# Patient Record
Sex: Male | Born: 1965 | Race: Black or African American | Hispanic: No | Smoking: Never smoker
Health system: Southern US, Community
[De-identification: ages and names within clinical notes are randomized; demographics above are authoritative.]

## PROBLEM LIST (undated history)

## (undated) DIAGNOSIS — E119 Type 2 diabetes mellitus without complications: Secondary | ICD-10-CM

## (undated) DIAGNOSIS — Z8619 Personal history of other infectious and parasitic diseases: Secondary | ICD-10-CM

## (undated) DIAGNOSIS — I10 Essential (primary) hypertension: Secondary | ICD-10-CM

## (undated) HISTORY — DX: Personal history of other infectious and parasitic diseases: Z86.19

## (undated) HISTORY — PX: BACK SURGERY: SHX140

## (undated) HISTORY — PX: SHOULDER ARTHROSCOPY: SHX128

---

## 2002-03-28 DIAGNOSIS — E11311 Type 2 diabetes mellitus with unspecified diabetic retinopathy with macular edema: Secondary | ICD-10-CM | POA: Insufficient documentation

## 2002-03-28 DIAGNOSIS — I10 Essential (primary) hypertension: Secondary | ICD-10-CM | POA: Insufficient documentation

## 2002-08-05 ENCOUNTER — Encounter: Admission: RE | Admit: 2002-08-05 | Discharge: 2002-11-03 | Payer: Self-pay | Admitting: Endocrinology

## 2005-02-11 ENCOUNTER — Ambulatory Visit: Payer: Self-pay | Admitting: Orthopaedic Surgery

## 2005-03-11 ENCOUNTER — Ambulatory Visit: Payer: Self-pay | Admitting: Orthopaedic Surgery

## 2006-09-29 ENCOUNTER — Emergency Department: Payer: Self-pay | Admitting: Emergency Medicine

## 2006-10-04 DIAGNOSIS — S39012A Strain of muscle, fascia and tendon of lower back, initial encounter: Secondary | ICD-10-CM | POA: Insufficient documentation

## 2006-10-12 DIAGNOSIS — E11311 Type 2 diabetes mellitus with unspecified diabetic retinopathy with macular edema: Secondary | ICD-10-CM | POA: Insufficient documentation

## 2007-10-09 DIAGNOSIS — R109 Unspecified abdominal pain: Secondary | ICD-10-CM | POA: Insufficient documentation

## 2007-10-11 ENCOUNTER — Ambulatory Visit: Payer: Self-pay | Admitting: Family Medicine

## 2007-11-08 ENCOUNTER — Ambulatory Visit: Payer: Self-pay | Admitting: Gastroenterology

## 2008-03-14 ENCOUNTER — Ambulatory Visit: Payer: Self-pay | Admitting: Otolaryngology

## 2008-07-14 ENCOUNTER — Ambulatory Visit: Payer: Self-pay | Admitting: Unknown Physician Specialty

## 2008-08-09 ENCOUNTER — Ambulatory Visit: Payer: Self-pay | Admitting: Physician Assistant

## 2008-08-14 ENCOUNTER — Ambulatory Visit: Payer: Self-pay | Admitting: Unknown Physician Specialty

## 2008-08-15 ENCOUNTER — Inpatient Hospital Stay: Payer: Self-pay | Admitting: Unknown Physician Specialty

## 2009-01-26 ENCOUNTER — Ambulatory Visit: Payer: Self-pay | Admitting: Gastroenterology

## 2009-02-16 ENCOUNTER — Ambulatory Visit: Payer: Self-pay | Admitting: Gastroenterology

## 2009-02-16 LAB — HM COLONOSCOPY: HM COLON: NORMAL

## 2009-04-22 DIAGNOSIS — K589 Irritable bowel syndrome without diarrhea: Secondary | ICD-10-CM | POA: Insufficient documentation

## 2009-06-09 DIAGNOSIS — Z8042 Family history of malignant neoplasm of prostate: Secondary | ICD-10-CM | POA: Insufficient documentation

## 2011-02-11 DIAGNOSIS — H3500 Unspecified background retinopathy: Secondary | ICD-10-CM | POA: Insufficient documentation

## 2011-02-24 LAB — PSA: PSA: 0.4

## 2012-09-17 LAB — BASIC METABOLIC PANEL
BUN: 11 mg/dL (ref 4–21)
Creatinine: 1.1 mg/dL (ref <=1.3)
Glucose: 112 mg/dL
Sodium: 143 mmol/L (ref 137–147)

## 2012-09-17 LAB — TSH: TSH: 2.63 u[IU]/mL (ref <=5.90)

## 2012-12-21 DIAGNOSIS — M5416 Radiculopathy, lumbar region: Secondary | ICD-10-CM | POA: Insufficient documentation

## 2012-12-21 DIAGNOSIS — M545 Low back pain, unspecified: Secondary | ICD-10-CM | POA: Insufficient documentation

## 2012-12-21 DIAGNOSIS — G8929 Other chronic pain: Secondary | ICD-10-CM | POA: Insufficient documentation

## 2014-04-07 ENCOUNTER — Encounter (INDEPENDENT_AMBULATORY_CARE_PROVIDER_SITE_OTHER): Payer: 59 | Admitting: Ophthalmology

## 2014-04-07 DIAGNOSIS — E11311 Type 2 diabetes mellitus with unspecified diabetic retinopathy with macular edema: Secondary | ICD-10-CM

## 2014-04-07 DIAGNOSIS — I10 Essential (primary) hypertension: Secondary | ICD-10-CM

## 2014-04-07 DIAGNOSIS — H43813 Vitreous degeneration, bilateral: Secondary | ICD-10-CM

## 2014-04-07 DIAGNOSIS — E11331 Type 2 diabetes mellitus with moderate nonproliferative diabetic retinopathy with macular edema: Secondary | ICD-10-CM

## 2014-04-07 DIAGNOSIS — H2513 Age-related nuclear cataract, bilateral: Secondary | ICD-10-CM

## 2014-04-07 DIAGNOSIS — H35033 Hypertensive retinopathy, bilateral: Secondary | ICD-10-CM

## 2014-04-11 ENCOUNTER — Encounter (INDEPENDENT_AMBULATORY_CARE_PROVIDER_SITE_OTHER): Payer: 59 | Admitting: Ophthalmology

## 2014-04-11 DIAGNOSIS — E11311 Type 2 diabetes mellitus with unspecified diabetic retinopathy with macular edema: Secondary | ICD-10-CM

## 2014-04-11 DIAGNOSIS — E11331 Type 2 diabetes mellitus with moderate nonproliferative diabetic retinopathy with macular edema: Secondary | ICD-10-CM

## 2014-05-09 ENCOUNTER — Encounter (INDEPENDENT_AMBULATORY_CARE_PROVIDER_SITE_OTHER): Payer: 59 | Admitting: Ophthalmology

## 2014-05-09 DIAGNOSIS — E10331 Type 1 diabetes mellitus with moderate nonproliferative diabetic retinopathy with macular edema: Secondary | ICD-10-CM

## 2014-05-09 DIAGNOSIS — E10339 Type 1 diabetes mellitus with moderate nonproliferative diabetic retinopathy without macular edema: Secondary | ICD-10-CM

## 2014-05-09 DIAGNOSIS — E10311 Type 1 diabetes mellitus with unspecified diabetic retinopathy with macular edema: Secondary | ICD-10-CM

## 2014-05-09 DIAGNOSIS — H2513 Age-related nuclear cataract, bilateral: Secondary | ICD-10-CM

## 2014-05-09 DIAGNOSIS — H35033 Hypertensive retinopathy, bilateral: Secondary | ICD-10-CM

## 2014-05-09 DIAGNOSIS — H43813 Vitreous degeneration, bilateral: Secondary | ICD-10-CM

## 2014-05-09 DIAGNOSIS — I10 Essential (primary) hypertension: Secondary | ICD-10-CM

## 2014-06-06 ENCOUNTER — Encounter (INDEPENDENT_AMBULATORY_CARE_PROVIDER_SITE_OTHER): Payer: 59 | Admitting: Ophthalmology

## 2014-06-06 DIAGNOSIS — E10311 Type 1 diabetes mellitus with unspecified diabetic retinopathy with macular edema: Secondary | ICD-10-CM

## 2014-06-06 DIAGNOSIS — E10331 Type 1 diabetes mellitus with moderate nonproliferative diabetic retinopathy with macular edema: Secondary | ICD-10-CM | POA: Diagnosis not present

## 2014-06-06 DIAGNOSIS — H35033 Hypertensive retinopathy, bilateral: Secondary | ICD-10-CM | POA: Diagnosis not present

## 2014-06-06 DIAGNOSIS — H43813 Vitreous degeneration, bilateral: Secondary | ICD-10-CM | POA: Diagnosis not present

## 2014-06-06 DIAGNOSIS — I10 Essential (primary) hypertension: Secondary | ICD-10-CM | POA: Diagnosis not present

## 2014-06-06 DIAGNOSIS — E10329 Type 1 diabetes mellitus with mild nonproliferative diabetic retinopathy without macular edema: Secondary | ICD-10-CM | POA: Diagnosis not present

## 2014-06-27 HISTORY — PX: CATARACT EXTRACTION: SUR2

## 2014-07-11 ENCOUNTER — Encounter (INDEPENDENT_AMBULATORY_CARE_PROVIDER_SITE_OTHER): Payer: 59 | Admitting: Ophthalmology

## 2014-07-11 DIAGNOSIS — H35033 Hypertensive retinopathy, bilateral: Secondary | ICD-10-CM | POA: Diagnosis not present

## 2014-07-11 DIAGNOSIS — I10 Essential (primary) hypertension: Secondary | ICD-10-CM | POA: Diagnosis not present

## 2014-07-11 DIAGNOSIS — E10311 Type 1 diabetes mellitus with unspecified diabetic retinopathy with macular edema: Secondary | ICD-10-CM | POA: Diagnosis not present

## 2014-07-11 DIAGNOSIS — H43813 Vitreous degeneration, bilateral: Secondary | ICD-10-CM

## 2014-07-11 DIAGNOSIS — E10329 Type 1 diabetes mellitus with mild nonproliferative diabetic retinopathy without macular edema: Secondary | ICD-10-CM

## 2014-07-11 DIAGNOSIS — E10331 Type 1 diabetes mellitus with moderate nonproliferative diabetic retinopathy with macular edema: Secondary | ICD-10-CM | POA: Diagnosis not present

## 2014-07-11 DIAGNOSIS — H2513 Age-related nuclear cataract, bilateral: Secondary | ICD-10-CM

## 2014-07-25 LAB — HM DIABETES EYE EXAM

## 2014-08-08 ENCOUNTER — Ambulatory Visit
Admission: RE | Admit: 2014-08-08 | Discharge: 2014-08-08 | Disposition: A | Discharge status: Home / Self Care | Payer: 59 | Source: Ambulatory Visit | Attending: Family Medicine | Admitting: Family Medicine

## 2014-08-08 ENCOUNTER — Other Ambulatory Visit: Payer: Self-pay | Admitting: Family Medicine

## 2014-08-08 DIAGNOSIS — M542 Cervicalgia: Secondary | ICD-10-CM

## 2014-08-21 ENCOUNTER — Encounter (INDEPENDENT_AMBULATORY_CARE_PROVIDER_SITE_OTHER): Payer: 59 | Admitting: Ophthalmology

## 2014-08-21 DIAGNOSIS — E10311 Type 1 diabetes mellitus with unspecified diabetic retinopathy with macular edema: Secondary | ICD-10-CM | POA: Diagnosis not present

## 2014-08-21 DIAGNOSIS — I10 Essential (primary) hypertension: Secondary | ICD-10-CM | POA: Diagnosis not present

## 2014-08-21 DIAGNOSIS — E10331 Type 1 diabetes mellitus with moderate nonproliferative diabetic retinopathy with macular edema: Secondary | ICD-10-CM | POA: Diagnosis not present

## 2014-08-21 DIAGNOSIS — H35033 Hypertensive retinopathy, bilateral: Secondary | ICD-10-CM | POA: Diagnosis not present

## 2014-08-21 DIAGNOSIS — H43813 Vitreous degeneration, bilateral: Secondary | ICD-10-CM | POA: Diagnosis not present

## 2014-08-21 DIAGNOSIS — E10329 Type 1 diabetes mellitus with mild nonproliferative diabetic retinopathy without macular edema: Secondary | ICD-10-CM

## 2014-09-25 ENCOUNTER — Encounter (INDEPENDENT_AMBULATORY_CARE_PROVIDER_SITE_OTHER): Payer: 59 | Admitting: Ophthalmology

## 2014-09-25 DIAGNOSIS — E10331 Type 1 diabetes mellitus with moderate nonproliferative diabetic retinopathy with macular edema: Secondary | ICD-10-CM

## 2014-09-25 DIAGNOSIS — E10329 Type 1 diabetes mellitus with mild nonproliferative diabetic retinopathy without macular edema: Secondary | ICD-10-CM

## 2014-09-25 DIAGNOSIS — H35033 Hypertensive retinopathy, bilateral: Secondary | ICD-10-CM

## 2014-09-25 DIAGNOSIS — H2512 Age-related nuclear cataract, left eye: Secondary | ICD-10-CM

## 2014-09-25 DIAGNOSIS — H43813 Vitreous degeneration, bilateral: Secondary | ICD-10-CM | POA: Diagnosis not present

## 2014-09-25 DIAGNOSIS — E11311 Type 2 diabetes mellitus with unspecified diabetic retinopathy with macular edema: Secondary | ICD-10-CM | POA: Diagnosis not present

## 2014-09-25 DIAGNOSIS — I10 Essential (primary) hypertension: Secondary | ICD-10-CM | POA: Diagnosis not present

## 2014-11-10 HISTORY — PX: CERVICAL FUSION: SHX112

## 2014-11-15 ENCOUNTER — Emergency Department
Admission: EM | Admit: 2014-11-15 | Discharge: 2014-11-15 | Disposition: A | Discharge status: Other Acute Inpatient Hospital | Payer: 59 | Attending: Emergency Medicine | Admitting: Emergency Medicine

## 2014-11-15 ENCOUNTER — Emergency Department: Payer: 59

## 2014-11-15 DIAGNOSIS — L7632 Postprocedural hematoma of skin and subcutaneous tissue following other procedure: Secondary | ICD-10-CM

## 2014-11-15 DIAGNOSIS — Y838 Other surgical procedures as the cause of abnormal reaction of the patient, or of later complication, without mention of misadventure at the time of the procedure: Secondary | ICD-10-CM | POA: Insufficient documentation

## 2014-11-15 DIAGNOSIS — E119 Type 2 diabetes mellitus without complications: Secondary | ICD-10-CM | POA: Insufficient documentation

## 2014-11-15 DIAGNOSIS — L7622 Postprocedural hemorrhage and hematoma of skin and subcutaneous tissue following other procedure: Secondary | ICD-10-CM | POA: Insufficient documentation

## 2014-11-15 DIAGNOSIS — S1093XA Contusion of unspecified part of neck, initial encounter: Secondary | ICD-10-CM

## 2014-11-15 HISTORY — DX: Type 2 diabetes mellitus without complications: E11.9

## 2014-11-15 HISTORY — DX: Essential (primary) hypertension: I10

## 2014-11-15 LAB — COMPREHENSIVE METABOLIC PANEL
ALBUMIN: 4.3 g/dL (ref 3.5–5.0)
ALK PHOS: 38 U/L (ref 38–126)
ALT: 23 U/L (ref 17–63)
AST: 24 U/L (ref 15–41)
Anion gap: 8 (ref 5–15)
BILIRUBIN TOTAL: 1.5 mg/dL — AB (ref 0.3–1.2)
BUN: 13 mg/dL (ref 6–20)
CALCIUM: 9.6 mg/dL (ref 8.9–10.3)
CO2: 28 mmol/L (ref 22–32)
Chloride: 102 mmol/L (ref 101–111)
Creatinine, Ser: 1.06 mg/dL (ref 0.61–1.24)
GFR calc Af Amer: >60 mL/min (ref >60)
Glucose, Bld: 74 mg/dL (ref 65–99)
Potassium: 4 mmol/L (ref 3.5–5.1)
Sodium: 138 mmol/L (ref 135–145)
TOTAL PROTEIN: 7.9 g/dL (ref 6.5–8.1)

## 2014-11-15 LAB — CBC
HEMATOCRIT: 37.9 % — AB (ref 40.0–52.0)
HEMOGLOBIN: 12.5 g/dL — AB (ref 13.0–18.0)
MCH: 27.7 pg (ref 26.0–34.0)
MCHC: 33 g/dL (ref 32.0–36.0)
MCV: 84.1 fL (ref 80.0–100.0)
Platelets: 276 10*3/uL (ref 150–440)
RBC: 4.51 MIL/uL (ref 4.40–5.90)
RDW: 14.2 % (ref 11.5–14.5)
WBC: 6.9 10*3/uL (ref 3.8–10.6)

## 2014-11-15 LAB — PROTIME-INR
INR: 1.08
PROTHROMBIN TIME: 14.2 s (ref 11.4–15.0)

## 2014-11-15 MED ORDER — PROMETHAZINE HCL 25 MG/ML IJ SOLN
25.0000 mg | Freq: Once | INTRAMUSCULAR | Status: AC
  Administered 2014-11-15: 25 mg via INTRAVENOUS
  Filled 2014-11-15: qty 1

## 2014-11-15 MED ORDER — SODIUM CHLORIDE 0.9 % IV BOLUS (SEPSIS)
1000.0000 mL | Freq: Once | INTRAVENOUS | Status: AC
  Administered 2014-11-15: 1000 mL via INTRAVENOUS

## 2014-11-15 MED ORDER — DEXAMETHASONE SODIUM PHOSPHATE 10 MG/ML IJ SOLN
10.0000 mg | Freq: Once | INTRAMUSCULAR | Status: AC
  Administered 2014-11-15: 10 mg via INTRAVENOUS
  Filled 2014-11-15: qty 1

## 2014-11-15 MED ORDER — SODIUM CHLORIDE 0.9 % IV SOLN
3.0000 g | INTRAVENOUS | Status: AC
  Administered 2014-11-15: 3 g via INTRAVENOUS
  Filled 2014-11-15: qty 3

## 2014-11-15 MED ORDER — MORPHINE SULFATE (PF) 4 MG/ML IV SOLN
4.0000 mg | Freq: Once | INTRAVENOUS | Status: AC
  Administered 2014-11-15: 4 mg via INTRAVENOUS
  Filled 2014-11-15: qty 1

## 2014-11-15 MED ORDER — IOHEXOL 300 MG/ML  SOLN
75.0000 mL | Freq: Once | INTRAMUSCULAR | Status: AC | PRN
  Administered 2014-11-15: 75 mL via INTRAVENOUS

## 2014-11-15 NOTE — ED Notes (Signed)
MD notified of pt's c/o worsening gurgling and difficulty breathing. Pt is maintaining airway, O2 sats WNL.

## 2014-11-15 NOTE — ED Provider Notes (Signed)
-----------------------------------------   3:30 PM on 11/15/2014 -----------------------------------------   Blood pressure 140/84, pulse 88, temperature 98.4 F (36.9 C), temperature source Oral, resp. rate 18, height  (1.93 m), weight 290 lb (131.543 kg), SpO2 97 %.  Assuming care from Dr. Lenard Lance.  In short, Jimmy Mills is a 49 y.o. male with a chief complaint of Post-op Problem .  Refer to the original H&P for additional details.  The current plan of care is to follow-up the CT scan of the patient's neck and reassess.  ----------------------------------------- 5:05 PM on 11/15/2014 -----------------------------------------  I followed up the CT scan results and it is very concerning for an expanding hematoma but more worrisome for a postoperative infection in spite of the fact that he is afebrile and has no leukocytosis.  There is gas present in the wound and soft tissue swelling described as "severe" by the radiologist.  I ordered Decadron 10 mg IV to help with swelling and Unasyn 3 g IV as empiric antibiotics treatment.  I then called our on-call otolaryngologist, Dr. Jenne Campus.  I discussed the case with him by phone and he said that he would come to the emergency department for evaluation of her wanted, but he strongly encouraged me to get in touch with the patient's surgeon and get the patient transferred to the appropriate facility as soon as possible.  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  I updated the patient and got the name of the surgeon, Dr. Iline Oven, who is associated with [facility].  We are trying to track down the on-call surgeon for his group at this time.  ----------------------------------------- 5:16 PM on 11/15/2014 -----------------------------------------  Just spoke by phone with the on-call surgeon, Dr. Lynnae Prude,  for [facility].  I discussed  the case with him.  He is going to talk with the spine surgeons in his group and call me back.  ----------------------------------------- 5:54 PM on 11/15/2014 -----------------------------------------  Spoke again with Dr. Winfred Leeds who spoke with the spine surgeon, Dr. Waymon Budge.  Dr. Waymon Budge reportedly feels it is unlikely to be an infection but is willing to take the patient as a transfer to [facility].  At Dr. Winfred Leeds request, I spoke directly to the charge nurse, Margretta Sidle [phone]), and gave report.  I also stressed, both to Dr. Winfred Leeds and to [facility], that the patient is finding it increasingly difficult to swallow, though he is non-toxic appearing and is still easily protecting his airway.  I strongly suggested to [facility] that they make sure Dr. Waymon Budge is aware of the patient's arrival so that he can come evaluate the patient in person.  I updated the patient and reassessed him.  Again, he is protecting his airway and is handling his secretions, and I believe he is stable for urgent transport to [facility] - at this time the benefits outweigh the risks, and the risk of intubating this patient while he is stable are too great given that he is not in immediate danger.     Loleta Rose, MD 11/15/14 1800

## 2014-11-15 NOTE — ED Notes (Signed)
Pt returned from CT scan.

## 2014-11-15 NOTE — ED Provider Notes (Signed)
[facility] Emergency Department Provider Note  Time seen: 1:51 PM  I have reviewed the triage vital signs and the nursing notes.   HISTORY  Chief Complaint Post-op Problem    HPI Jimmy Mills is a 49 y.o. male with a past medical history of diabetes, recent cervical spinal fusion 1 week ago presents the emergency department with continued bleeding from his surgical wound, along with swelling and trouble swallowing. According to the patient he has had mild bleeding from the neck wound since they pulled the drain the day after surgery. Says the bleeding has continued and it soaks through the dressing approximately every 2 days. What he is more concerned about is he feels that the area has been swelling, and he now feels like it is more difficult for him to swallow or take a deep breath. Patient states pain to the area, but no acute increase in pain since his surgery. If anything the pain has decreased per patient. Denies any weakness or numbness of any arm or leg. Denies any fevers. Describes his neck pain is moderate currently but largely unchanged since surgery.    Past Medical History  Diagnosis Date  . Diabetes mellitus without complication     There are no active problems to display for this patient.   Past Surgical History  Procedure Laterality Date  . Cervical fusion  11/10/2014  . Back surgery    . Shoulder arthroscopy Right     No current outpatient prescriptions on file.  Allergies Review of patient's allergies indicates no known allergies.  No family history on file.  Social History Social History  Substance Use Topics  . Smoking status: Never Smoker   . Smokeless tobacco: Never Used  . Alcohol Use: No    Review of Systems Constitutional: Negative for fever. Cardiovascular: Negative for chest pain. Respiratory: Negative for shortness of breath. Gastrointestinal: Negative for abdominal pain Neurological: Negative for headaches,  focal weakness or numbness. 10-point ROS otherwise negative.  ____________________________________________   PHYSICAL EXAM:  VITAL SIGNS: ED Triage Vitals  Enc Vitals Group     BP 11/15/14 1308 140/84 mmHg     Pulse Rate 11/15/14 1308 88     Resp 11/15/14 1308 18     Temp 11/15/14 1308 98.4 F (36.9 C)     Temp Source 11/15/14 1308 Oral     SpO2 11/15/14 1308 99 %     Weight 11/15/14 1308 290 lb (131.543 kg)     Height 11/15/14 1308  (1.93 m)     Head Cir --      Peak Flow --      Pain Score 11/15/14 1309 6     Pain Loc --      Pain Edu? --      Excl. in GC? --     Constitutional: Alert and oriented. Well appearing and in no distress. Eyes: Normal exam ENT   Head: Normocephalic and atraumatic.   Mouth/Throat: Mucous membranes are moist. No oral/pharyngeal swelling noted. Soft c-collar in place. Patient has a surgical incision approximately 4 cm in length of the left anterior neck. Small area approximately 0.5 cm is slightly open, with the patient states he had a drain placed. Mild oozing of blood from this area, otherwise appears well. Mi mild swelling consistent with mild hematoma present. Cardiovascular: Normal rate, regular rhythm. No murmur Respiratory: Normal respiratory effort without tachypnea nor retractions. Breath sounds are clear and equal bilaterally. No wheezes/rales/rhonchi. Gastrointestinal: Soft and nontender. No  distention.   Musculoskeletal: Nontender with normal range of motion in all extremities.  Neurologic:  Normal speech and language. No gross focal neurologic deficits Skin:  Skin is warm, dry and intact.  Psychiatric: Mood and affect are normal. Speech and behavior are normal.   ____________________________________________   INITIAL IMPRESSION / ASSESSMENT AND PLAN / ED COURSE  Pertinent labs & imaging results that were available during my care of the patient were reviewed by me and considered in my medical decision making (see chart  for details).  Patient overall appears quite well. He does have minimal oozing of blood from his surgical incision, as well as mild swelling consistent with a small hematoma underlying the skin. Do not see any external signs of airway compromise. No stridor on exam. We will check labs, and proceed with a CT neck with contrast to help evaluate the vasculature, rule out enlarging hematoma, or mass effect on the airway.   Labs within normal limits. CT pending patient care signed out to Dr. York Cerise.  ____________________________________________   FINAL CLINICAL IMPRESSION(S) / ED DIAGNOSES  Postsurgical bleeding Postsurgical swelling   Minna Antis, MD 11/15/14 [phone]

## 2014-11-15 NOTE — ED Notes (Signed)
Pt states he had cervical fusion surgery on Monday at [facility] in [place]..states he has had continuous oozing of blood from site with swelling today having difficulty breathing and swallowing..pt is alert on arrival..

## 2014-12-30 ENCOUNTER — Encounter: Payer: 59 | Admitting: Family Medicine

## 2014-12-30 DIAGNOSIS — E7800 Pure hypercholesterolemia, unspecified: Secondary | ICD-10-CM | POA: Insufficient documentation

## 2014-12-30 DIAGNOSIS — M65959 Unspecified synovitis and tenosynovitis, unspecified thigh: Secondary | ICD-10-CM | POA: Insufficient documentation

## 2014-12-30 DIAGNOSIS — M542 Cervicalgia: Secondary | ICD-10-CM | POA: Insufficient documentation

## 2014-12-30 DIAGNOSIS — M659 Synovitis and tenosynovitis, unspecified: Secondary | ICD-10-CM | POA: Insufficient documentation

## 2014-12-30 DIAGNOSIS — K5900 Constipation, unspecified: Secondary | ICD-10-CM | POA: Insufficient documentation

## 2014-12-30 DIAGNOSIS — Z8619 Personal history of other infectious and parasitic diseases: Secondary | ICD-10-CM | POA: Insufficient documentation

## 2014-12-30 DIAGNOSIS — E669 Obesity, unspecified: Secondary | ICD-10-CM | POA: Insufficient documentation

## 2014-12-30 DIAGNOSIS — L309 Dermatitis, unspecified: Secondary | ICD-10-CM | POA: Insufficient documentation

## 2014-12-30 DIAGNOSIS — K5909 Other constipation: Secondary | ICD-10-CM | POA: Insufficient documentation

## 2014-12-30 HISTORY — DX: Personal history of other infectious and parasitic diseases: Z86.19

## 2015-01-01 ENCOUNTER — Encounter: Payer: Self-pay | Admitting: Family Medicine

## 2015-01-01 ENCOUNTER — Ambulatory Visit (INDEPENDENT_AMBULATORY_CARE_PROVIDER_SITE_OTHER): Payer: 59 | Admitting: Family Medicine

## 2015-01-01 ENCOUNTER — Encounter: Payer: 59 | Admitting: Family Medicine

## 2015-01-01 ENCOUNTER — Ambulatory Visit
Admission: RE | Admit: 2015-01-01 | Discharge: 2015-01-01 | Disposition: A | Discharge status: Home / Self Care | Payer: 59 | Source: Ambulatory Visit | Attending: Family Medicine | Admitting: Family Medicine

## 2015-01-01 ENCOUNTER — Telehealth: Payer: Self-pay | Admitting: *Deleted

## 2015-01-01 VITALS — BP 104/64 | HR 83 | Temp 98.8°F | Resp 16 | Ht 76.0 in | Wt 299.0 lb

## 2015-01-01 DIAGNOSIS — R600 Localized edema: Secondary | ICD-10-CM | POA: Diagnosis present

## 2015-01-01 DIAGNOSIS — E669 Obesity, unspecified: Secondary | ICD-10-CM

## 2015-01-01 MED ORDER — CEPHALEXIN 500 MG PO CAPS: 500.0000 mg | ORAL_CAPSULE | Freq: Four times a day (QID) | ORAL | Status: AC

## 2015-01-01 NOTE — Telephone Encounter (Signed)
Patient notified of results. Patient expressed understanding.  

## 2015-01-01 NOTE — Telephone Encounter (Signed)
Please advise there is no sign of a blood clot in his leg. i think he has a mild infection and have sent rx for cephalexin to [facility] pharmacy. He needs to keep leg elevated when not ambulating. Call if not much better over the weekend.

## 2015-01-01 NOTE — Progress Notes (Signed)
       Patient: Jimmy Mills Male    DOB: August 26, 1965   49 y.o.   MRN: [id number] Visit Date: 01/01/2015  Today's Provider: Mila Merry, MD   Chief Complaint  Patient presents with  . Foot Swelling  . Joint Swelling   Subjective:    HPI One week swelling in right foot and ankles. Feet a little sore. Has had no recent injuries. No redness or sores in feet. No dyspnea. No chest pains. No recent medication changes. No OTC medications.  Had neck surgery in August and has had to limit his activity. He also recently had trip by air to [place].    Obesity: Has put on weight since his neck surgery in August. His BMI is over the goal set by his [employer] and he needs alternative goal. He usually walks 3-4 miles a day. He is noted to have gained 9 pounds since August, but is well below his weight last year of 306 pounds one year ago.     Wt Readings from Last 3 Encounters:  01/01/15 299 lb (135.626 kg)  11/15/14 290 lb (131.543 kg)       Allergies  Allergen Reactions  . Doxycycline Rash   Previous Medications   AMLODIPINE (NORVASC) 5 MG TABLET    Take 5 mg by mouth daily.   DICYCLOMINE (BENTYL) 20 MG TABLET       INSULIN LISPRO PROTAMINE-LISPRO (HUMALOG 75/25 MIX) (75-25) 100 UNIT/ML SUSP INJECTION    Inject 25 Units into the skin 2 (two) times daily with a meal.   LOSARTAN (COZAAR) 100 MG TABLET    Take 100 mg by mouth daily.   ONE TOUCH ULTRA TEST TEST STRIP       ROSUVASTATIN (CRESTOR) 5 MG TABLET    Take 10 mg by mouth 3 (three) times a week.   SITAGLIPTIN-METFORMIN (JANUMET) 50-1000 MG PER TABLET    Take 1 tablet by mouth 2 (two) times daily with a meal.    Review of Systems  Cardiovascular: Negative for chest pain and palpitations.  Musculoskeletal: Positive for joint swelling.       Right foot and ankle swollen x4-5 days. Left foot and ankle also swollen  Neurological: Negative for dizziness and light-headedness.    Social History  Substance Use  Topics  . Smoking status: Never Smoker   . Smokeless tobacco: Never Used  . Alcohol Use: No   Objective:   BP 104/64 mmHg  Pulse 83  Temp(Src) 98.8 F (37.1 C) (Oral)  Resp 16  Ht  (1.93 m)  Wt 299 lb (135.626 kg)  BMI 36.41 kg/m2  SpO2 97%  Physical Exam  General: WD/WN overweight in NAD Lungs CTA, normal work of breathing CV RRR without murmurs Ext: Diffuse faint erythema of right foot, ankle and leg to mid calf. 1+ edema. + calf tenderness. Negative Homan's sign.     Assessment & Plan:     1. Edema of right lower extremity No precipitating event. Consider presence of erythema and calf tenderness we need to rule out DVT as he has had recent air travle and is not on aspirin therapy.  - US Venous Img Lower Unilateral Right; Future Consider abx if ultrasound is negative.   2. Obesity Completed forms for health insurance with alternative BMI goal of 35 by losing 1-2 pounds per month until weight is <190 pounds.        Mila Merry, MD  [facility]

## 2015-01-01 NOTE — Telephone Encounter (Signed)
[facility] contacted office with a call-report for the patient, US venous imaging of lower bilateral right was negative.

## 2015-01-04 NOTE — Progress Notes (Signed)
This encounter was created in error - please disregard.

## 2015-05-27 ENCOUNTER — Encounter: Payer: Self-pay | Admitting: Family Medicine

## 2015-05-27 ENCOUNTER — Ambulatory Visit (INDEPENDENT_AMBULATORY_CARE_PROVIDER_SITE_OTHER): Payer: 59 | Admitting: Family Medicine

## 2015-05-27 VITALS — BP 102/72 | HR 72 | Temp 98.2°F | Resp 16 | Ht 76.0 in | Wt 300.0 lb

## 2015-05-27 DIAGNOSIS — J069 Acute upper respiratory infection, unspecified: Secondary | ICD-10-CM

## 2015-05-27 DIAGNOSIS — R52 Pain, unspecified: Secondary | ICD-10-CM | POA: Diagnosis not present

## 2015-05-27 LAB — POCT INFLUENZA A/B
INFLUENZA A, POC: NEGATIVE
INFLUENZA B, POC: NEGATIVE

## 2015-05-27 MED ORDER — AZITHROMYCIN 250 MG PO TABS: ORAL_TABLET | ORAL | Status: AC

## 2015-05-27 NOTE — Progress Notes (Signed)
Patient: Jimmy Mills Male    DOB: [birth year]   50 y.o.   MRN: [id number] Visit Date: 05/27/2015  Today's Provider: Mila Merry, MD   Chief Complaint  Patient presents with  . Fever   Subjective:    Fever  This is a new problem. The current episode started in the past 7 days (05/21/2015). The problem occurs intermittently. The problem has been unchanged. His temperature was unmeasured prior to arrival. Associated symptoms include congestion, coughing, ear pain, headaches and muscle aches. Pertinent negatives include no abdominal pain, chest pain, diarrhea, nausea, rash, sleepiness, sore throat, urinary pain, vomiting or wheezing. Treatments tried: mucinex  The treatment provided mild relief.   Chills and sweats started 05/21/2015. Body aches, congestion, cough.    Allergies  Allergen Reactions  . Doxycycline Rash   Previous Medications   AMLODIPINE (NORVASC) 5 MG TABLET    Take 5 mg by mouth daily.   DICYCLOMINE (BENTYL) 20 MG TABLET       INSULIN LISPRO PROTAMINE-LISPRO (HUMALOG 75/25 MIX) (75-25) 100 UNIT/ML SUSP INJECTION    Inject 25 Units into the skin 2 (two) times daily with a meal.   LOSARTAN (COZAAR) 100 MG TABLET    Take 100 mg by mouth daily.   ONE TOUCH ULTRA TEST TEST STRIP       ROSUVASTATIN (CRESTOR) 5 MG TABLET    Take 10 mg by mouth 3 (three) times a week.   SITAGLIPTIN-METFORMIN (JANUMET) 50-1000 MG PER TABLET    Take 1 tablet by mouth daily.     Review of Systems  Constitutional: Positive for fever.  HENT: Positive for congestion, ear discharge and ear pain. Negative for sore throat.        Left ear  Respiratory: Positive for cough. Negative for wheezing.   Cardiovascular: Negative for chest pain.  Gastrointestinal: Negative for nausea, vomiting, abdominal pain and diarrhea.  Genitourinary: Negative for dysuria.  Skin: Negative for rash.  Neurological: Positive for headaches.    Social History  Substance Use Topics  . Smoking status: Never  Smoker   . Smokeless tobacco: Never Used  . Alcohol Use: No   Objective:   BP 102/72 mmHg  Pulse 72  Temp(Src) 98.2 F (36.8 C) (Oral)  Resp 16  Ht  (1.93 m)  Wt 300 lb (136.079 kg)  BMI 36.53 kg/m2  SpO2 98%  Physical Exam  General Appearance:    Alert, cooperative, no distress  HENT:   bilateral TM normal without fluid or infection, neck without nodes, sinuses tender and nasal mucosa congested  Eyes:    PERRL, conjunctiva/corneas clear, EOM's intact       Lungs:     Occasional expiratory wheeze, no rales, no rhonchi respirations unlabored  Heart:    Regular rate and rhythm  Neurologic:   Awake, alert, oriented x 3. No apparent focal neurological           defect.         Results for orders placed or performed in visit on 05/27/15  POCT Influenza A/B  Result Value Ref Range   Influenza A, POC Negative Negative   Influenza B, POC Negative Negative       Assessment & Plan:      1. Body aches  - POCT Influenza A/B  2. Upper respiratory infection Worsening after nearly a week. Start abx.  - azithromycin (ZITHROMAX) 250 MG tablet; 2 by mouth today, then 1 daily for 4 days  Dispense:  6 tablet; Refill: 0  Call if symptoms change or if not rapidly improving.          Mila Merry, MD  [facility] +

## 2015-05-27 NOTE — Patient Instructions (Signed)
Upper Respiratory Infection, Adult Most upper respiratory infections (URIs) are a viral infection of the air passages leading to the lungs. A URI affects the nose, throat, and upper air passages. The most common type of URI is nasopharyngitis and is typically referred to as "the common cold." URIs run their course and usually go away on their own. Most of the time, a URI does not require medical attention, but sometimes a bacterial infection in the upper airways can follow a viral infection. This is called a secondary infection. Sinus and middle ear infections are common types of secondary upper respiratory infections. Bacterial pneumonia can also complicate a URI. A URI can worsen asthma and chronic obstructive pulmonary disease (COPD). Sometimes, these complications can require emergency medical care and may be life threatening.  CAUSES Almost all URIs are caused by viruses. A virus is a type of germ and can spread from one person to another.  RISKS FACTORS You may be at risk for a URI if:   You smoke.   You have chronic heart or lung disease.  You have a weakened defense (immune) system.   You are very young or very old.   You have nasal allergies or asthma.  You work in crowded or poorly ventilated areas.  You work in health care facilities or schools. SIGNS AND SYMPTOMS  Symptoms typically develop 2-3 days after you come in contact with a cold virus. Most viral URIs last 7-10 days. However, viral URIs from the influenza virus (flu virus) can last 14-18 days and are typically more severe. Symptoms may include:   Runny or stuffy (congested) nose.   Sneezing.   Cough.   Sore throat.   Headache.   Fatigue.   Fever.   Loss of appetite.   Pain in your forehead, behind your eyes, and over your cheekbones (sinus pain).  Muscle aches.  DIAGNOSIS  Your health care provider may diagnose a URI by:  Physical exam.  Tests to check that your symptoms are not due to  another condition such as:  Strep throat.  Sinusitis.  Pneumonia.  Asthma. TREATMENT  A URI goes away on its own with time. It cannot be cured with medicines, but medicines may be prescribed or recommended to relieve symptoms. Medicines may help:  Reduce your fever.  Reduce your cough.  Relieve nasal congestion. HOME CARE INSTRUCTIONS   Take medicines only as directed by your health care provider.   Gargle warm saltwater or take cough drops to comfort your throat as directed by your health care provider.  Use a warm mist humidifier or inhale steam from a shower to increase air moisture. This may make it easier to breathe.  Drink enough fluid to keep your urine clear or pale yellow.   Eat soups and other clear broths and maintain good nutrition.   Rest as needed.   Return to work when your temperature has returned to normal or as your health care provider advises. You may need to stay home longer to avoid infecting others. You can also use a face mask and careful hand washing to prevent spread of the virus.  Increase the usage of your inhaler if you have asthma.   Do not use any tobacco products, including cigarettes, chewing tobacco, or electronic cigarettes. If you need help quitting, ask your health care provider. PREVENTION  The best way to protect yourself from getting a cold is to practice good hygiene.   Avoid oral or hand contact with people with cold   symptoms.   Wash your hands often if contact occurs.  There is no clear evidence that vitamin C, vitamin E, echinacea, or exercise reduces the chance of developing a cold. However, it is always recommended to get plenty of rest, exercise, and practice good nutrition.  SEEK MEDICAL CARE IF:   You are getting worse rather than better.   Your symptoms are not controlled by medicine.   You have chills.  You have worsening shortness of breath.  You have brown or red mucus.  You have yellow or brown nasal  discharge.  You have pain in your face, especially when you bend forward.  You have a fever.  You have swollen neck glands.  You have pain while swallowing.  You have white areas in the back of your throat. SEEK IMMEDIATE MEDICAL CARE IF:   You have severe or persistent:  Headache.  Ear pain.  Sinus pain.  Chest pain.  You have chronic lung disease and any of the following:  Wheezing.  Prolonged cough.  Coughing up blood.  A change in your usual mucus.  You have a stiff neck.  You have changes in your:  Vision.  Hearing.  Thinking.  Mood. MAKE SURE YOU:   Understand these instructions.  Will watch your condition.  Will get help right away if you are not doing well or get worse.   This information is not intended to replace advice given to you by your health care provider. Make sure you discuss any questions you have with your health care provider.   Document Released: 09/07/2000 Document Revised: 07/29/2014 Document Reviewed: 06/19/2013 Elsevier Interactive Patient Education 2016 [employer] Inc[employer]  

## 2015-09-15 ENCOUNTER — Encounter: Payer: Self-pay | Admitting: Family Medicine

## 2015-09-15 ENCOUNTER — Ambulatory Visit (INDEPENDENT_AMBULATORY_CARE_PROVIDER_SITE_OTHER): Payer: 59 | Admitting: Family Medicine

## 2015-09-15 VITALS — BP 112/76 | HR 84 | Temp 98.4°F | Resp 16 | Wt 307.0 lb

## 2015-09-15 DIAGNOSIS — L200 Besnier's prurigo: Secondary | ICD-10-CM | POA: Diagnosis not present

## 2015-09-15 DIAGNOSIS — L239 Allergic contact dermatitis, unspecified cause: Secondary | ICD-10-CM

## 2015-09-15 NOTE — Progress Notes (Signed)
       Patient: Jimmy FinchKenneth Mihelich Male    DOB: 04/21/1965   50 y.o.   MRN: [id number] Visit Date: 09/15/2015  Today's Provider: Mila Merryonald Fisher, MD   Chief Complaint  Patient presents with  . Pruritis   Subjective:    HPI Itchiness: Patient comes in today complaining of itchiness on his arms and legs x 4 days. He believes he may be having an allergic reaction to shell fish (crab) he 4 nights ago. He states shortly after eating the crab, he became itchy and developed small bumps on his arms and legs. Patient has tried taking Benadryl which helps quite a bit. l. Patient denies any shortness of breath or dyspnea.     Allergies  Allergen Reactions  . Doxycycline Rash   Current Meds  Medication Sig  . amLODipine (NORVASC) 5 MG tablet Take 5 mg by mouth daily.  . insulin lispro protamine-lispro (HUMALOG 75/25 MIX) (75-25) 100 UNIT/ML SUSP injection Inject 25 Units into the skin 2 (two) times daily with a meal.  . losartan (COZAAR) 100 MG tablet Take 100 mg by mouth daily.  . ONE TOUCH ULTRA TEST test strip   . rosuvastatin (CRESTOR) 5 MG tablet Take 10 mg by mouth 3 (three) times a week.  . sitaGLIPtin-metformin (JANUMET) 50-1000 MG per tablet Take 1 tablet by mouth daily.   . [DISCONTINUED] dicyclomine (BENTYL) 20 MG tablet     Review of Systems  Constitutional: Negative for fever, chills and appetite change.  Respiratory: Negative for chest tightness, shortness of breath and wheezing.   Cardiovascular: Negative for chest pain and palpitations.  Gastrointestinal: Negative for nausea, vomiting and abdominal pain.  Skin: Positive for rash.       Itchy skin    Social History  Substance Use Topics  . Smoking status: Never Smoker   . Smokeless tobacco: Never Used  . Alcohol Use: No   Objective:   BP 112/76 mmHg  Pulse 84  Temp(Src) 98.4 F (36.9 C) (Oral)  Resp 16  Wt 307 lb (139.254 kg)  SpO2 97%  Physical Exam  General appearance: alert, well developed, well nourished,  cooperative and in no distress Head: Normocephalic, without obvious abnormality, atraumatic Respiratory: Respirations even and unlabored, normal respiratory rate Extremities: No gross deformities Skin: Few scattered patches of flesh colored tiny papular lesion on forearms and lower legs.      Assessment & Plan:     1. Allergic dermatitis Slowly improving, expect complete resolution in 3-4 days. Can take OTC cetirizine or fexofenadine.      The entirety of the information documented in the History of Present Illness, Review of Systems and Physical Exam were personally obtained by me. Portions of this information were initially documented by Awilda Billoshena Chambers, CMA and reviewed by me for thoroughness and accuracy.    Mila Merryonald Fisher, MD  [facility]

## 2015-10-20 ENCOUNTER — Encounter: Payer: 59 | Admitting: Family Medicine

## 2015-11-25 ENCOUNTER — Ambulatory Visit (INDEPENDENT_AMBULATORY_CARE_PROVIDER_SITE_OTHER): Payer: 59 | Admitting: Family Medicine

## 2015-11-25 ENCOUNTER — Encounter: Payer: Self-pay | Admitting: Family Medicine

## 2015-11-25 VITALS — BP 110/78 | HR 75 | Temp 97.8°F | Resp 16 | Ht 76.0 in | Wt 303.0 lb

## 2015-11-25 DIAGNOSIS — Z0000 Encounter for general adult medical examination without abnormal findings: Secondary | ICD-10-CM

## 2015-11-25 DIAGNOSIS — E11311 Type 2 diabetes mellitus with unspecified diabetic retinopathy with macular edema: Secondary | ICD-10-CM | POA: Diagnosis not present

## 2015-11-25 DIAGNOSIS — I10 Essential (primary) hypertension: Secondary | ICD-10-CM

## 2015-11-25 DIAGNOSIS — Z125 Encounter for screening for malignant neoplasm of prostate: Secondary | ICD-10-CM

## 2015-11-25 DIAGNOSIS — S86011A Strain of right Achilles tendon, initial encounter: Secondary | ICD-10-CM | POA: Diagnosis not present

## 2015-11-25 DIAGNOSIS — K588 Other irritable bowel syndrome: Secondary | ICD-10-CM

## 2015-11-25 DIAGNOSIS — Z794 Long term (current) use of insulin: Secondary | ICD-10-CM | POA: Diagnosis not present

## 2015-11-25 MED ORDER — NABUMETONE 500 MG PO TABS: 1000.0000 mg | ORAL_TABLET | Freq: Every day | ORAL | 0 refills | Status: AC

## 2015-11-25 NOTE — Progress Notes (Signed)
Patient: Jimmy Mills, Male    DOB: 1965/12/15, 50 y.o.   MRN: [id number] Visit Date: 11/25/2015  Today's Provider: Mila Merryonald Tavari Loadholt, MD   Chief Complaint  Patient presents with  . Annual Exam  . Hypertension    follow up  . Diabetes    follow up  . Hyperlipidemia    follow up   Subjective:    Annual physical exam Jimmy FinchKenneth Kaine is a 50 y.o. male who presents today for health maintenance and complete physical. He feels fairly well. He reports exercising daily. He reports he is sleeping poorly.  -----------------------------------------------------------------  Hypertension, follow-up:  BP Readings from Last 3 Encounters:  09/15/15 112/76  05/27/15 102/72  01/01/15 104/64    He was last seen for hypertension 2 years ago.  BP at that visit was 122/86. Management since that visit includes no changes. He reports good compliance with treatment. He is not having side effects.  He is exercising. He is adherent to low salt diet.   Outside blood pressures are not being checked. He is experiencing none.  Patient denies chest pain, chest pressure/discomfort, claudication, dyspnea, exertional chest pressure/discomfort, fatigue, irregular heart beat, lower extremity edema, near-syncope, orthopnea, palpitations, paroxysmal nocturnal dyspnea, syncope and tachypnea.   Cardiovascular risk factors include hypertension and male gender.  Use of agents associated with hypertension: none.     Weight trend: fluctuating a bit Wt Readings from Last 3 Encounters:  09/15/15 (!) 307 lb (139.3 kg)  05/27/15 300 lb (136.1 kg)  01/01/15 299 lb (135.6 kg)    Current diet: well balanced  ------------------------------------------------------------------------ Follow up Diabetes: Diabetes is being followed by Endocrinology Dr. Chestine Sporelark.  Follow up Hypercholesterolemia: Cholesterol is being monitored by Dr. Chestine Sporelark. He had cholesterol checked through work at [employer] on 7-26 with total  cholesterol = 143 and HDL = 44.   Review of Systems  Constitutional: Negative for appetite change, chills, fatigue and fever.  HENT: Negative for congestion, ear pain, hearing loss, nosebleeds and trouble swallowing.   Eyes: Negative for pain and visual disturbance.  Respiratory: Negative for cough, chest tightness and shortness of breath.   Cardiovascular: Negative for chest pain, palpitations and leg swelling.  Gastrointestinal: Negative for abdominal pain, blood in stool, constipation, diarrhea, nausea and vomiting.  Endocrine: Negative for polydipsia, polyphagia and polyuria.  Genitourinary: Negative for dysuria and flank pain.  Musculoskeletal: Negative for arthralgias, back pain, joint swelling, myalgias and neck stiffness.  Skin: Negative for color change, rash and wound.  Neurological: Negative for dizziness, tremors, seizures, speech difficulty, weakness, light-headedness and headaches.  Psychiatric/Behavioral: Negative for behavioral problems, confusion, decreased concentration, dysphoric mood and sleep disturbance. The patient is not nervous/anxious.   All other systems reviewed and are negative.   Social History      He  reports that he has never smoked. He has never used smokeless tobacco. He reports that he does not drink alcohol or use drugs.       Social History   Social History  . Marital status: Married    Spouse name: N/A  . Number of children: 2  . Years of education: N/A   Occupational History  .  [employer]    works as Armed forces technical officerVice  president in [employer]   Social History Main Topics  . Smoking status: Never Smoker  . Smokeless tobacco: Never Used  . Alcohol use No  . Drug use: No  . Sexual activity: Yes   Other Topics Concern  . None  Social History Narrative  . None    Past Medical History:  Diagnosis Date  . History of chicken pox 12/30/2014   DID have Chicken Pox. DID have Mumps.       Patient Active Problem List   Diagnosis Date Noted  . Constipation  12/30/2014  . Dermatitis, eczematoid 12/30/2014  . Cervical pain 12/30/2014  . Obesity 12/30/2014  . Pure hypercholesterolemia 12/30/2014  . Tenosynovitis of hip 12/30/2014  . Chronic LBP 12/21/2012  . Lumbar radiculopathy 12/21/2012  . Retinopathy 02/11/2011  . Family history of malignant neoplasm of prostate 06/09/2009  . Irritable bowel syndrome 04/22/2009  . Abdominal pain 10/09/2007  . Diabetic macular edema (HCC) 10/12/2006  . Back strain 10/04/2006  . 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 Essential hypertension 03/28/2002    Past Surgical History:  Procedure Laterality Date  . BACK SURGERY    . CERVICAL FUSION  11/10/2014  . SHOULDER ARTHROSCOPY Right     Family History        Family Status  Relation Status  . Mother Alive  . Father Alive  . Sister Alive  . Daughter Alive  . Son Alive  . Sister Alive        His family history includes Cancer in his father; Diabetes in his mother; Glaucoma in his mother.    Allergies  Allergen Reactions  . Doxycycline Rash    Current Meds  Medication Sig  . amLODipine (NORVASC) 5 MG tablet Take 5 mg by mouth daily.  . insulin lispro protamine-lispro (HUMALOG 75/25 MIX) (75-25) 100 UNIT/ML SUSP injection Inject 25 Units into the skin 2 (two) times daily with a meal.  . Liraglutide (VICTOZA) 18 MG/3ML SOPN Inject 1.8 mg into the skin daily.  Marland Kitchen losartan (COZAAR) 100 MG tablet Take 100 mg by mouth daily.  . ONE TOUCH ULTRA TEST test strip   . rosuvastatin (CRESTOR) 5 MG tablet Take 10 mg by mouth 3 (three) times a week.  . [DISCONTINUED] sitaGLIPtin-metformin (JANUMET) 50-1000 MG per tablet Take 1 tablet by mouth daily.     Patient Care Team: Malva Limes, MD as PCP - General (Family Medicine) Laurena Slimmer, MD as Consulting Physician (Endocrinology) Sherrie George, MD as Consulting Physician (Ophthalmology)     Objective:   Vitals: BP  110/78 (BP Location: Left Arm, Patient Position: Sitting, Cuff Size: Large)   Pulse 75   Temp 97.8 F (36.6 C) (Oral)   Resp 16   Ht 6\' 4"  (1.93 m)   Wt (!) 303 lb (137.4 kg)   SpO2 98%   BMI 36.88 kg/m    Physical Exam   General Appearance:    Alert, cooperative, no distress, appears stated age  Head:    Normocephalic, without obvious abnormality, atraumatic  Eyes:    PERRL, conjunctiva/corneas clear, EOM's intact, fundi    benign, both eyes       Ears:    Normal TM's and external ear canals, both ears  Nose:   Nares normal, septum midline, mucosa normal, no drainage   or sinus tenderness  Throat:   Lips, mucosa, and tongue normal; teeth and gums normal  Neck:   Supple, symmetrical, trachea midline, no adenopathy;       thyroid:  No enlargement/tenderness/nodules; no carotid   bruit or JVD  Back:     Symmetric, no curvature, ROM normal, no CVA tenderness  Lungs:     Clear to  auscultation bilaterally, respirations unlabored  Chest wall:    No tenderness or deformity  Heart:    Regular rate and rhythm, S1 and S2 normal, no murmur, rub   or gallop  Abdomen:     Soft, non-tender, bowel sounds active all four quadrants,    no masses, no organomegaly  Genitalia:    deferred  Rectal:    deferred  Extremities:   Extremities normal, atraumatic, no cyanosis or edema  Pulses:   2+ and symmetric all extremities  Skin:   Skin color, texture, turgor normal, no rashes or lesions  Lymph nodes:   Cervical, supraclavicular, and axillary nodes normal  Neurologic:   CNII-XII intact. Normal strength, sensation and reflexes      throughout    Depression Screen PHQ 2/9 Scores 11/25/2015  PHQ - 2 Score 0  PHQ- 9 Score 3   Current Exercise Habits: Home exercise routine, Type of exercise: walking, Time (Minutes): 30, Frequency (Times/Week): 7, Weekly Exercise (Minutes/Week): 210, Intensity: Moderate Exercise limited by: None identified    Assessment & Plan:     Routine Health Maintenance  and Physical Exam  Exercise Activities and Dietary recommendations Goals    None      Immunization History  Administered Date(s) Administered  . Influenza-Unspecified 12/27/2014  . Pneumococcal Polysaccharide-23 02/24/2011  . Tdap 02/24/2011    Health Maintenance  Topic Date Due  . HEMOGLOBIN A1C  03/03/65  . FOOT EXAM  11/15/1974  . HIV Screening  11/15/1979  . OPHTHALMOLOGY EXAM  07/25/2015  . INFLUENZA VACCINE  10/27/2015  . PNEUMOCOCCAL POLYSACCHARIDE VACCINE (2) 02/24/2016  . COLONOSCOPY  02/17/2019  . TETANUS/TDAP  02/23/2021      Discussed health benefits of physical activity, and encouraged him to engage in regular exercise appropriate for his age and condition.    --------------------------------------------------------------------  1. Annual physical exam Cholesterol was done at work and scheduled to get flu shot at work - EKG 12-Lead  2. Prostate cancer screening  - PSA  3. Strain of Achilles tendon, right, initial encounter Advised to call for podiatry referral if not better in 1-2 weeks.  - nabumetone (RELAFEN) 500 MG tablet; Take 2 tablets (1,000 mg total) by mouth daily.  Dispense: 30 tablet; Refill: 0  4. Other irritable bowel syndrome Fairly well controlled. UTD  On colonoscopy.   5. Essential hypertension Well controlled. . Ccm  - EKG 12-Lead - Renal function panel  6. 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 Managed by Dr. Chestine Spore - EKG 12-Lead    Mila Merry, MD  [facility]

## 2015-11-25 NOTE — Patient Instructions (Signed)
Achilles Tendinitis Achilles tendinitis is inflammation of the tough, cord-like band that attaches the lower muscles of your leg to your heel (Achilles tendon). It is usually caused by overusing the tendon and joint involved.  CAUSES Achilles tendinitis can happen because of:  A sudden increase in exercise or activity (such as running).  Doing the same exercises or activities (such as jumping) over and over.  Not warming up calf muscles before exercising.  Exercising in shoes that are worn out or not made for exercise.  Having arthritis or a bone growth on the back of the heel bone. This can rub against the tendon and hurt the tendon. SIGNS AND SYMPTOMS The most common symptoms are:  Pain in the back of the leg, just above the heel. The pain usually gets worse with exercise and better with rest.  Stiffness or soreness in the back of the leg, especially in the morning.  Swelling of the skin over the Achilles tendon.  Trouble standing on tiptoe. Sometimes, an Achilles tendon tears (ruptures). Symptoms of an Achilles tendon rupture can include:  Sudden, severe pain in the back of the leg.  Trouble putting weight on the foot or walking normally. DIAGNOSIS Achilles tendinitis will be diagnosed based on symptoms and a physical examination. An X-ray may be done to check if another condition is causing your symptoms. An MRI may be ordered if your health care provider suspects you may have completely torn your tendon, which is called an Achilles tendon rupture.  TREATMENT  Achilles tendinitis usually gets better over time. It can take weeks to months to heal completely. Treatment focuses on treating the symptoms and helping the injury heal. HOME CARE INSTRUCTIONS   Rest your Achilles tendon and avoid activities that cause pain.  Apply ice to the injured area:  Put ice in a plastic bag.  Place a towel between your skin and the bag.  Leave the ice on for 20 minutes, 2-3 times a  day  Try to avoid using the tendon (other than gentle range of motion) while the tendon is painful. Do not resume use until instructed by your health care provider. Then begin use gradually. Do not increase use to the point of pain. If pain does develop, decrease use and continue the above measures. Gradually increase activities that do not cause discomfort until you achieve normal use.  Do exercises to make your calf muscles stronger and more flexible. Your health care provider or physical therapist can recommend exercises for you to do.  Wrap your ankle with an elastic bandage or other wrap. This can help keep your tendon from moving too much. Your health care provider will show you how to wrap your ankle correctly.  Only take over-the-counter or prescription medicines for pain, discomfort, or fever as directed by your health care provider. SEEK MEDICAL CARE IF:   Your pain and swelling increase or pain is uncontrolled with medicines.  You develop new, unexplained symptoms or your symptoms get worse.  You are unable to move your toes or foot.  You develop warmth and swelling in your foot.  You have an unexplained temperature. MAKE SURE YOU:   Understand these instructions.  Will watch your condition.  Will get help right away if you are not doing well or get worse.   This information is not intended to replace advice given to you by your health care provider. Make sure you discuss any questions you have with your health care provider.   Document Released:   12/22/2004 Document Revised: 04/04/2014 Document Reviewed: 10/24/2012 Elsevier Interactive Patient Education 2016 Elsevier Inc.  

## 2015-11-26 LAB — RENAL FUNCTION PANEL
ALBUMIN: 4.5 g/dL (ref 3.5–5.5)
BUN/Creatinine Ratio: 14 (ref 9–20)
BUN: 15 mg/dL (ref 6–24)
CALCIUM: 9.4 mg/dL (ref 8.7–10.2)
CHLORIDE: 101 mmol/L (ref 96–106)
CO2: 26 mmol/L (ref 18–29)
Creatinine, Ser: 1.05 mg/dL (ref 0.76–1.27)
GFR calc Af Amer: 95 mL/min/{1.73_m2} (ref >59)
GFR calc non Af Amer: 82 mL/min/{1.73_m2} (ref >59)
Glucose: 76 mg/dL (ref 65–99)
Phosphorus: 4 mg/dL (ref 2.5–4.5)
Potassium: 4.4 mmol/L (ref 3.5–5.2)
Sodium: 141 mmol/L (ref 134–144)

## 2015-11-26 LAB — PSA: PROSTATE SPECIFIC AG, SERUM: 0.5 ng/mL (ref 0.0–4.0)

## 2016-01-01 ENCOUNTER — Ambulatory Visit (INDEPENDENT_AMBULATORY_CARE_PROVIDER_SITE_OTHER): Payer: 59 | Admitting: Podiatry

## 2016-01-01 ENCOUNTER — Ambulatory Visit (INDEPENDENT_AMBULATORY_CARE_PROVIDER_SITE_OTHER): Payer: 59

## 2016-01-01 ENCOUNTER — Telehealth: Payer: Self-pay | Admitting: *Deleted

## 2016-01-01 ENCOUNTER — Other Ambulatory Visit: Payer: Self-pay | Admitting: *Deleted

## 2016-01-01 ENCOUNTER — Encounter: Payer: Self-pay | Admitting: Podiatry

## 2016-01-01 VITALS — BP 123/78 | HR 73 | Resp 16

## 2016-01-01 DIAGNOSIS — M7661 Achilles tendinitis, right leg: Secondary | ICD-10-CM

## 2016-01-01 DIAGNOSIS — M7751 Other enthesopathy of right foot: Secondary | ICD-10-CM

## 2016-01-01 DIAGNOSIS — M659 Synovitis and tenosynovitis, unspecified: Secondary | ICD-10-CM

## 2016-01-01 DIAGNOSIS — M25571 Pain in right ankle and joints of right foot: Secondary | ICD-10-CM | POA: Diagnosis not present

## 2016-01-01 DIAGNOSIS — M79661 Pain in right lower leg: Secondary | ICD-10-CM

## 2016-01-01 DIAGNOSIS — R600 Localized edema: Secondary | ICD-10-CM | POA: Diagnosis not present

## 2016-01-01 NOTE — Progress Notes (Signed)
   Subjective:    Patient ID: Jimmy Mills, male    DOB: 11-Aug-1965, 50 y.o.   MRN: [id number]  HPI    Review of Systems  Cardiovascular: Positive for leg swelling.  Musculoskeletal: Positive for gait problem.  All other systems reviewed and are negative.      Objective:   Physical Exam        Assessment & Plan:

## 2016-01-01 NOTE — Telephone Encounter (Addendum)
-----   Message from Felecia ShellingBrent M Evans, DPM sent at 01/01/2016 11:03 AM EDT ----- Regarding: [facility].  Please schedule [facility]   MRI RT ankle.  MRI RT leg.   Dx :  1. Severe ankle and leg edema right.  2. 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  3. Possible achiles tendon injury. [facility]. 01/13/2016-[employer] did not approve MRI [id number] right ankle without contrast, requires PEER TO PEER [phone] for case# [id number]. [employer] APPROVED MRI [id number] RIGHT TIBIA FIBULA WITHOUT CONTRAST, APPROVAL [phone], VALID 01/13/2016 - 02/27/2016.01/18/2016-I spoke with Dr. Logan BoresEvans concerning PEER to PEER for pt's right ankle, he states [facility], he does not need the MRIs. MRIs cancelled with [facility] at [facility] central scheduling.

## 2016-01-02 NOTE — Progress Notes (Signed)
Patient ID: Jimmy Mills, male   DOB: Oct 03, 1965, 50 y.o.   MRN: [id number] Subjective:  Patient presents today for right foot and ankle and leg pain and swelling and edema. Patient states that for the past 6 months she's been walking to increase his exercise. Approximately 2 months ago he noticed Achilles tendon pain in the right lower extremity. Patient also noticed that he began to get severe edema to the right foot and ankle. Patient presents today for further treatment and evaluation    Objective/Physical Exam General: The patient is alert and oriented x3 in no acute distress.  Dermatology: Skin is warm, dry and supple bilateral lower extremities. Negative for open lesions or macerations.  Vascular: Right lower extremity edema encompassing the foot ankle and leg of the right lower extremity. Skin is warm to touch. Palpable pedal pulses bilaterally. No edema or erythema noted. Capillary refill within normal limits.  Neurological: Epicritic and protective threshold grossly intact bilaterally.   Musculoskeletal Exam: Pain on palpation to the posterior aspect of the gastroc aponeurosis right lower extremity. Pain on palpation to the medial lateral and anterior aspects of the right ankle. Palpable mass noted to the Achilles tendon approximately 3 cm proximal to the insertion likely due to an old injury.  Range of motion within normal limits to all pedal and ankle joints bilateral. Muscle strength 5/5 in all groups bilateral.   Radiographic Exam:  Normal osseous mineralization. Joint spaces preserved. No fracture/dislocation/boney destruction.    Assessment: #1 possible gastroc aponeurosis tear right lower extremity #2 possible Achilles tendon tear right lower extremity #3 edema right leg ankle and foot #4 pain in right leg ankle and foot   Plan of Care:  #1 Patient was evaluated. #2 today and Unna boot compression wrap was applied to the right lower extremity #3 cam boot for  immobilization was dispensed #4 MRI given for right ankle and leg #5 patient is to return to clinic in 2 weeks for review of MRI results  And further management  Patient works for [employer]   Dr. Felecia ShellingBrent M. Evans, DPM [facility]

## 2016-01-12 ENCOUNTER — Ambulatory Visit (INDEPENDENT_AMBULATORY_CARE_PROVIDER_SITE_OTHER): Payer: 59 | Admitting: Ophthalmology

## 2016-01-12 ENCOUNTER — Telehealth: Payer: Self-pay | Admitting: *Deleted

## 2016-01-12 DIAGNOSIS — I10 Essential (primary) hypertension: Secondary | ICD-10-CM | POA: Diagnosis not present

## 2016-01-12 DIAGNOSIS — E11311 Type 2 diabetes mellitus with unspecified diabetic retinopathy with macular edema: Secondary | ICD-10-CM

## 2016-01-12 DIAGNOSIS — H43813 Vitreous degeneration, bilateral: Secondary | ICD-10-CM

## 2016-01-12 DIAGNOSIS — E113292 Type 2 diabetes mellitus with mild nonproliferative diabetic retinopathy without macular edema, left eye: Secondary | ICD-10-CM | POA: Diagnosis not present

## 2016-01-12 DIAGNOSIS — E113311 Type 2 diabetes mellitus with moderate nonproliferative diabetic retinopathy with macular edema, right eye: Secondary | ICD-10-CM | POA: Diagnosis not present

## 2016-01-12 DIAGNOSIS — H35033 Hypertensive retinopathy, bilateral: Secondary | ICD-10-CM

## 2016-01-12 NOTE — Telephone Encounter (Signed)
"  He's coming for a MRI tibial/fibia right w/o contrast and also MRI of right ankle w/o contrast that does require authorization.  Please give me a call with that.  Thank you so much.

## 2016-01-14 ENCOUNTER — Ambulatory Visit: Admission: RE | Admit: 2016-01-14 | Payer: 59 | Source: Ambulatory Visit

## 2016-01-14 ENCOUNTER — Ambulatory Visit: Payer: 59

## 2016-01-15 ENCOUNTER — Ambulatory Visit (INDEPENDENT_AMBULATORY_CARE_PROVIDER_SITE_OTHER): Payer: 59 | Admitting: Podiatry

## 2016-01-15 DIAGNOSIS — M79661 Pain in right lower leg: Secondary | ICD-10-CM | POA: Diagnosis not present

## 2016-01-15 DIAGNOSIS — M7661 Achilles tendinitis, right leg: Secondary | ICD-10-CM

## 2016-01-15 DIAGNOSIS — R600 Localized edema: Secondary | ICD-10-CM | POA: Diagnosis not present

## 2016-01-15 DIAGNOSIS — M25571 Pain in right ankle and joints of right foot: Secondary | ICD-10-CM | POA: Diagnosis not present

## 2016-01-15 MED ORDER — IBUPROFEN-FAMOTIDINE 800-26.6 MG PO TABS: 1.0000 | ORAL_TABLET | Freq: Three times a day (TID) | ORAL | 1 refills | Status: DC

## 2016-01-15 NOTE — Progress Notes (Signed)
Subjective:  Patient presents today for follow-up evaluation of right posterior gastroc aponeurosis pain possible tear. Patient did not get an MRI. Patient states he feels significantly better    Objective/Physical Exam General: The patient is alert and oriented x3 in no acute distress.  Dermatology: Skin is warm, dry and supple bilateral lower extremities. Negative for open lesions or macerations.  Vascular: Palpable pedal pulses bilaterally. No edema or erythema noted. Capillary refill within normal limits.  Neurological: Epicritic and protective threshold grossly intact bilaterally.   Musculoskeletal Exam: Minimal pain on palpation to the posterior aspect of the gastroc aponeurosis right lower extremity. Range of motion within normal limits to all pedal and ankle joints bilateral. Muscle strength 5/5 in all groups bilateral.   Radiographic Exam:  Normal osseous mineralization. Joint spaces preserved. No fracture/dislocation/boney destruction.    Assessment: #1 possible gastroc given neurosis tear right lower extremity #2 possible Achilles tendon tear right lower extremity #3 edema right leg ankle and foot-improved #4 pain in right leg ankle and foot-improved   Plan of Care:  #1 Patient was evaluated. #2 patient is to continue conservative management including rest ice elevation and compression. #3 recommend slowly incorporate physical activity #4 prescription for Duexis was dispensed #5 patient is to return to clinic when necessary   Dr. Felecia ShellingBrent M. Evans, DPM [facility]

## 2016-01-28 NOTE — Telephone Encounter (Signed)
MRI request was canceled.  Per Dr. Logan BoresEvans, patient is doing better.

## 2016-01-29 ENCOUNTER — Encounter (INDEPENDENT_AMBULATORY_CARE_PROVIDER_SITE_OTHER): Payer: 59 | Admitting: Ophthalmology

## 2016-01-29 DIAGNOSIS — E113311 Type 2 diabetes mellitus with moderate nonproliferative diabetic retinopathy with macular edema, right eye: Secondary | ICD-10-CM | POA: Diagnosis not present

## 2016-01-29 DIAGNOSIS — E11311 Type 2 diabetes mellitus with unspecified diabetic retinopathy with macular edema: Secondary | ICD-10-CM

## 2016-02-02 ENCOUNTER — Telehealth: Payer: Self-pay | Admitting: Family Medicine

## 2016-02-02 NOTE — Telephone Encounter (Signed)
I called patient to get information & a verbal order from patient (02-02-16) on sending his records to [facility]. Patient gave me the ok to send records that they are asking for. Thanks CC

## 2016-02-09 ENCOUNTER — Other Ambulatory Visit (INDEPENDENT_AMBULATORY_CARE_PROVIDER_SITE_OTHER): Payer: 59 | Admitting: Ophthalmology

## 2016-02-12 ENCOUNTER — Other Ambulatory Visit (INDEPENDENT_AMBULATORY_CARE_PROVIDER_SITE_OTHER): Payer: 59 | Admitting: Ophthalmology

## 2016-02-12 DIAGNOSIS — E113512 Type 2 diabetes mellitus with proliferative diabetic retinopathy with macular edema, left eye: Secondary | ICD-10-CM | POA: Diagnosis not present

## 2016-02-12 DIAGNOSIS — E11311 Type 2 diabetes mellitus with unspecified diabetic retinopathy with macular edema: Secondary | ICD-10-CM | POA: Diagnosis not present

## 2016-03-04 ENCOUNTER — Encounter (INDEPENDENT_AMBULATORY_CARE_PROVIDER_SITE_OTHER): Payer: 59 | Admitting: Ophthalmology

## 2016-03-04 DIAGNOSIS — E113592 Type 2 diabetes mellitus with proliferative diabetic retinopathy without macular edema, left eye: Secondary | ICD-10-CM | POA: Diagnosis not present

## 2016-03-04 DIAGNOSIS — I10 Essential (primary) hypertension: Secondary | ICD-10-CM | POA: Diagnosis not present

## 2016-03-04 DIAGNOSIS — E113211 Type 2 diabetes mellitus with mild nonproliferative diabetic retinopathy with macular edema, right eye: Secondary | ICD-10-CM | POA: Diagnosis not present

## 2016-03-04 DIAGNOSIS — H35033 Hypertensive retinopathy, bilateral: Secondary | ICD-10-CM

## 2016-03-04 DIAGNOSIS — E11311 Type 2 diabetes mellitus with unspecified diabetic retinopathy with macular edema: Secondary | ICD-10-CM

## 2016-03-04 DIAGNOSIS — H43813 Vitreous degeneration, bilateral: Secondary | ICD-10-CM | POA: Diagnosis not present

## 2016-04-08 ENCOUNTER — Encounter (INDEPENDENT_AMBULATORY_CARE_PROVIDER_SITE_OTHER): Payer: 59 | Admitting: Ophthalmology

## 2016-07-19 ENCOUNTER — Encounter (INDEPENDENT_AMBULATORY_CARE_PROVIDER_SITE_OTHER): Payer: 59 | Admitting: Ophthalmology

## 2016-07-22 ENCOUNTER — Encounter (INDEPENDENT_AMBULATORY_CARE_PROVIDER_SITE_OTHER): Payer: 59 | Admitting: Ophthalmology

## 2016-07-22 DIAGNOSIS — I10 Essential (primary) hypertension: Secondary | ICD-10-CM

## 2016-07-22 DIAGNOSIS — E10319 Type 1 diabetes mellitus with unspecified diabetic retinopathy without macular edema: Secondary | ICD-10-CM

## 2016-07-22 DIAGNOSIS — H43813 Vitreous degeneration, bilateral: Secondary | ICD-10-CM | POA: Diagnosis not present

## 2016-07-22 DIAGNOSIS — E103391 Type 1 diabetes mellitus with moderate nonproliferative diabetic retinopathy without macular edema, right eye: Secondary | ICD-10-CM | POA: Diagnosis not present

## 2016-07-22 DIAGNOSIS — H35033 Hypertensive retinopathy, bilateral: Secondary | ICD-10-CM

## 2016-07-22 DIAGNOSIS — E103592 Type 1 diabetes mellitus with proliferative diabetic retinopathy without macular edema, left eye: Secondary | ICD-10-CM | POA: Diagnosis not present

## 2016-09-26 ENCOUNTER — Encounter (INDEPENDENT_AMBULATORY_CARE_PROVIDER_SITE_OTHER): Payer: 59 | Admitting: Ophthalmology

## 2016-09-30 ENCOUNTER — Encounter (INDEPENDENT_AMBULATORY_CARE_PROVIDER_SITE_OTHER): Payer: 59 | Admitting: Ophthalmology

## 2016-09-30 DIAGNOSIS — E103211 Type 1 diabetes mellitus with mild nonproliferative diabetic retinopathy with macular edema, right eye: Secondary | ICD-10-CM | POA: Diagnosis not present

## 2016-09-30 DIAGNOSIS — E103592 Type 1 diabetes mellitus with proliferative diabetic retinopathy without macular edema, left eye: Secondary | ICD-10-CM | POA: Diagnosis not present

## 2016-09-30 DIAGNOSIS — H43813 Vitreous degeneration, bilateral: Secondary | ICD-10-CM | POA: Diagnosis not present

## 2016-09-30 DIAGNOSIS — E10311 Type 1 diabetes mellitus with unspecified diabetic retinopathy with macular edema: Secondary | ICD-10-CM | POA: Diagnosis not present

## 2016-09-30 DIAGNOSIS — H35033 Hypertensive retinopathy, bilateral: Secondary | ICD-10-CM

## 2016-09-30 DIAGNOSIS — I10 Essential (primary) hypertension: Secondary | ICD-10-CM | POA: Diagnosis not present

## 2016-10-12 LAB — LIPID PANEL
CHOLESTEROL: 139 (ref 0–200)
HDL: 49 (ref 35–70)
LDL Cholesterol: 80
TRIGLYCERIDES: 49 (ref 40–160)

## 2016-10-12 LAB — HEMOGLOBIN A1C: HEMOGLOBIN A1C: 6.1

## 2016-10-12 LAB — BASIC METABOLIC PANEL: Glucose: 98

## 2016-11-24 ENCOUNTER — Encounter: Payer: Self-pay | Admitting: Family Medicine

## 2016-11-24 ENCOUNTER — Ambulatory Visit (INDEPENDENT_AMBULATORY_CARE_PROVIDER_SITE_OTHER): Payer: 59 | Admitting: Family Medicine

## 2016-11-24 ENCOUNTER — Encounter (INDEPENDENT_AMBULATORY_CARE_PROVIDER_SITE_OTHER): Payer: 59 | Admitting: Ophthalmology

## 2016-11-24 VITALS — BP 118/74 | HR 74 | Temp 97.9°F | Resp 16 | Ht 76.0 in | Wt 281.0 lb

## 2016-11-24 DIAGNOSIS — K588 Other irritable bowel syndrome: Secondary | ICD-10-CM

## 2016-11-24 DIAGNOSIS — E113211 Type 2 diabetes mellitus with mild nonproliferative diabetic retinopathy with macular edema, right eye: Secondary | ICD-10-CM

## 2016-11-24 DIAGNOSIS — H43813 Vitreous degeneration, bilateral: Secondary | ICD-10-CM

## 2016-11-24 DIAGNOSIS — I10 Essential (primary) hypertension: Secondary | ICD-10-CM

## 2016-11-24 DIAGNOSIS — Z0000 Encounter for general adult medical examination without abnormal findings: Secondary | ICD-10-CM | POA: Diagnosis not present

## 2016-11-24 DIAGNOSIS — Z125 Encounter for screening for malignant neoplasm of prostate: Secondary | ICD-10-CM

## 2016-11-24 DIAGNOSIS — H35033 Hypertensive retinopathy, bilateral: Secondary | ICD-10-CM | POA: Diagnosis not present

## 2016-11-24 DIAGNOSIS — E11311 Type 2 diabetes mellitus with unspecified diabetic retinopathy with macular edema: Secondary | ICD-10-CM

## 2016-11-24 DIAGNOSIS — E113592 Type 2 diabetes mellitus with proliferative diabetic retinopathy without macular edema, left eye: Secondary | ICD-10-CM | POA: Diagnosis not present

## 2016-11-24 DIAGNOSIS — Z23 Encounter for immunization: Secondary | ICD-10-CM

## 2016-11-24 DIAGNOSIS — E119 Type 2 diabetes mellitus without complications: Secondary | ICD-10-CM

## 2016-11-24 NOTE — Progress Notes (Signed)
Patient: Jimmy Mills Decoste, Male    DOB: 1965-08-17, 51 y.o.   MRN: [id number] Visit Date: 11/24/2016  Today's Provider: Mila Merryonald Shawnta Schlegel, MD   Chief Complaint  Patient presents with  . Annual Exam  . Hypertension  . Diabetes  . Irritable Bowel Syndrome   Subjective:    Annual physical exam Jimmy Mills Montefusco is a 51 y.o. male who presents today for health maintenance and complete physical. He feels well. He reports exercising 7 days a week. He reports he is sleeping well.  Colonoscopy- 02/16/2009. Normal.    Hypertension, follow-up:  BP Readings from Last 3 Encounters:  11/24/16 118/74  01/01/16 123/78  11/25/15 110/78    He was last seen for hypertension 1 years ago.  BP at that visit was 110/78. Management since that visit includes no changes. He reports good compliance with treatment. He is not having side effects.  He is exercising. He is adherent to low salt diet.   Outside blood pressures are checked occasionally. He is experiencing none.  Patient denies exertional chest pressure/discomfort, lower extremity edema and palpitations.   Cardiovascular risk factors include diabetes mellitus and dyslipidemia.     Weight trend: stable Wt Readings from Last 3 Encounters:  11/24/16 281 lb (127.5 kg)  11/25/15 (!) 303 lb (137.4 kg)  09/15/15 (!) 307 lb (139.3 kg)    Current diet: well balanced    Diabetes Mellitus Type II, Follow-up:   No results found for: HGBA1C  Last seen for diabetes 1 years ago.  Management since then includes no changes, also F/B Dr. Chestine Sporelark. He reports good compliance with treatment. He is not having side effects.  Current symptoms include none and have been stable. Home blood sugar records: trend: stable  Episodes of hypoglycemia? Sometimes, has a F/U with Dr. Chestine Sporelark soon. Last a1c was 6.1%   Most Recent Eye Exam: every 6 months, has a cataract.  Weight trend: stable Prior visit with dietician: no Current diet: well  balanced Current exercise: aerobics, walking and weightlifting  Pertinent Labs:    Component Value Date/Time   CREATININE 1.05 11/25/2015 1609    Wt Readings from Last 3 Encounters:  11/24/16 281 lb (127.5 kg)  11/25/15 (!) 303 lb (137.4 kg)  09/15/15 (!) 307 lb (139.3 kg)   Lab Results  Component Value Date   CHOL 139 10/12/2016   HDL 49 10/12/2016   LDLCALC 80 10/12/2016   TRIG 49 10/12/2016      IBS, follow up: Patient was last seen in the office 1 year ago. No changes were made in his medications. He is currently taking Bentyl 20mg , and reports good symptom control.    Review of Systems  Constitutional: Negative.   HENT: Negative.   Eyes: Negative.   Respiratory: Negative.   Cardiovascular: Negative.   Gastrointestinal: Negative.   Endocrine: Negative.   Genitourinary: Negative.   Musculoskeletal: Negative.   Skin: Negative.   Allergic/Immunologic: Negative.   Neurological: Negative.   Hematological: Negative.   Psychiatric/Behavioral: Negative.     Social History      He  reports that he has never smoked. He has never used smokeless tobacco. He reports that he does not drink alcohol or use drugs.       Social History   Social History  . Marital status: Married    Spouse name: N/A  . Number of children: 2  . Years of education: N/A   Occupational History  .  [employer]  works as Armed forces technical officer in [employer]   Social History Main Topics  . Smoking status: Never Smoker  . Smokeless tobacco: Never Used  . Alcohol use No  . Drug use: No  . Sexual activity: Yes   Other Topics Concern  . None   Social History Narrative  . None    Past Medical History:  Diagnosis Date  . History of chicken pox 12/30/2014   DID have Chicken Pox. DID have Mumps.       Patient Active Problem List   Diagnosis Date Noted  . Constipation 12/30/2014  . Dermatitis, eczematoid 12/30/2014  . Cervical pain 12/30/2014  . Obesity 12/30/2014  . Pure hypercholesterolemia  12/30/2014  . Tenosynovitis of hip 12/30/2014  . Chronic LBP 12/21/2012  . Lumbar radiculopathy 12/21/2012  . Retinopathy 02/11/2011  . Family history of malignant neoplasm of prostate 06/09/2009  . Irritable bowel syndrome 04/22/2009  . Abdominal pain 10/09/2007  . Diabetic macular edema (HCC) 10/12/2006  . Back strain 10/04/2006  . 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 Essential hypertension 03/28/2002    Past Surgical History:  Procedure Laterality Date  . BACK SURGERY    . CATARACT EXTRACTION Left 06/2014  . CERVICAL FUSION  11/10/2014  . SHOULDER ARTHROSCOPY Right     Family History        Family Status  Relation Status  . Mother Alive  . Father Deceased  . Sister Alive  . Daughter Alive  . Son Alive  . Sister Alive        His family history includes Cancer in his father; Dementia in his father; Diabetes in his mother; Glaucoma in his mother.     Allergies  Allergen Reactions  . Doxycycline Rash     Current Outpatient Prescriptions:  .  amLODipine (NORVASC) 5 MG tablet, Take 5 mg by mouth daily., Disp: , Rfl:  .  insulin lispro protamine-lispro (HUMALOG 75/25 MIX) (75-25) 100 UNIT/ML SUSP injection, Inject 25 Units into the skin 2 (two) times daily with a meal., Disp: , Rfl:  .  losartan (COZAAR) 100 MG tablet, Take 100 mg by mouth daily., Disp: , Rfl:  .  ONE TOUCH ULTRA TEST test strip, , Disp: , Rfl:  .  rosuvastatin (CRESTOR) 5 MG tablet, Take 10 mg by mouth 3 (three) times a week., Disp: , Rfl:  .  dicyclomine (BENTYL) 20 MG tablet, , Disp: , Rfl:  .  Ibuprofen-Famotidine (DUEXIS) 800-26.6 MG TABS, Take 1 tablet by mouth 3 (three) times daily. (Patient not taking: Reported on 11/24/2016), Disp: 90 tablet, Rfl: 1 .  Liraglutide (VICTOZA) 18 MG/3ML SOPN, Inject 1.8 mg into the skin daily., Disp: , Rfl:    Patient Care Team: Malva Limes, MD as PCP - General (Family  Medicine) Laurena Slimmer, MD as Consulting Physician (Endocrinology) Sherrie George, MD as Consulting Physician (Ophthalmology)      Objective:   Vitals: BP 118/74 (BP Location: Left Arm, Patient Position: Sitting, Cuff Size: Large)   Pulse 74   Temp 97.9 F (36.6 C)   Resp 16   Ht 6\' 4"  (1.93 m)   Wt 281 lb (127.5 kg)   SpO2 99%   BMI 34.20 kg/m    Vitals:   11/24/16 1004  BP: 118/74  Pulse: 74  Resp: 16  Temp: 97.9 F (36.6 C)  SpO2: 99%  Weight: 281 lb (127.5 kg)  Height: 6\' 4"  (1.93  m)     Physical Exam   General Appearance:    Alert, cooperative, no distress, appears stated age  Head:    Normocephalic, without obvious abnormality, atraumatic  Eyes:    PERRL, conjunctiva/corneas clear, EOM's intact, fundi    benign, both eyes       Ears:    Normal TM's and external ear canals, both ears  Nose:   Nares normal, septum midline, mucosa normal, no drainage   or sinus tenderness  Throat:   Lips, mucosa, and tongue normal; teeth and gums normal  Neck:   Supple, symmetrical, trachea midline, no adenopathy;       thyroid:  No enlargement/tenderness/nodules; no carotid   bruit or JVD  Back:     Symmetric, no curvature, ROM normal, no CVA tenderness  Lungs:     Clear to auscultation bilaterally, respirations unlabored  Chest wall:    No tenderness or deformity  Heart:    Regular rate and rhythm, S1 and S2 normal, no murmur, rub   or gallop  Abdomen:     Soft, non-tender, bowel sounds active all four quadrants,    no masses, no organomegaly  Genitalia:    deferred  Rectal:    deferred  Extremities:   Extremities normal, atraumatic, no cyanosis or edema  Pulses:   2+ and symmetric all extremities  Skin:   Skin color, texture, turgor normal, no rashes or lesions  Lymph nodes:   Cervical, supraclavicular, and axillary nodes normal  Neurologic:   CNII-XII intact. Normal strength, sensation and reflexes      throughout    Depression Screen PHQ 2/9 Scores 11/25/2015   PHQ - 2 Score 0  PHQ- 9 Score 3      Assessment & Plan:     Routine Health Maintenance and Physical Exam  Exercise Activities and Dietary recommendations Goals    None      Immunization History  Administered Date(s) Administered  . Influenza-Unspecified 12/27/2014  . Pneumococcal Polysaccharide-23 02/24/2011  . Tdap 02/24/2011    Health Maintenance  Topic Date Due  . HEMOGLOBIN A1C  24-Nov-1965  . FOOT EXAM  11/15/1974  . HIV Screening  11/15/1979  . OPHTHALMOLOGY EXAM  07/25/2015  . PNEUMOCOCCAL POLYSACCHARIDE VACCINE (2) 02/24/2016  . INFLUENZA VACCINE  10/26/2016  . COLONOSCOPY  02/17/2019  . TETANUS/TDAP  02/23/2021     Discussed health benefits of physical activity, and encouraged him to engage in regular exercise appropriate for his age and condition.    1. Annual physical exam  2. Essential hypertension Well controlled.  Continue current medications.    3. Other irritable bowel syndrome Stable. Continue current medications.    4. Diabetes mellitus without complication (HCC) Well controlled.   Continue regular follow up Dr. Chestine Spore.   5. Need for influenza vaccination  - Flu Vaccine QUAD 6+ mos PF IM (Fluarix Quad PF)  6. Prostate cancer screening  - PSA     Mila Merry, MD  [facility]

## 2016-11-25 LAB — PSA: Prostate Specific Ag, Serum: 0.4 ng/mL (ref 0.0–4.0)

## 2016-12-02 ENCOUNTER — Encounter: Payer: Self-pay | Admitting: Family Medicine

## 2017-01-13 ENCOUNTER — Encounter (INDEPENDENT_AMBULATORY_CARE_PROVIDER_SITE_OTHER): Payer: 59 | Admitting: Ophthalmology

## 2017-04-13 ENCOUNTER — Telehealth: Payer: Self-pay | Admitting: Family Medicine

## 2017-04-13 DIAGNOSIS — E11311 Type 2 diabetes mellitus with unspecified diabetic retinopathy with macular edema: Secondary | ICD-10-CM

## 2017-04-13 DIAGNOSIS — Z794 Long term (current) use of insulin: Principal | ICD-10-CM

## 2017-04-13 NOTE — Telephone Encounter (Signed)
Please advise 

## 2017-04-13 NOTE — Telephone Encounter (Signed)
Pt stated that Dr. Sherrie MustacheFisher had referred him to an endocrinologist and that provider has retired. Pt is requesting for a new referral to [place] in [place]. Dr. Sherlon Handinghomas L O Connell with [facility]. Pt tried to schedule and appt with Dr. Gershon Crane Connell but was advised they require a referral. Please advise. Thanks TNP

## 2017-04-14 ENCOUNTER — Encounter (INDEPENDENT_AMBULATORY_CARE_PROVIDER_SITE_OTHER): Payer: BLUE CROSS/BLUE SHIELD | Admitting: Ophthalmology

## 2017-04-14 DIAGNOSIS — H43813 Vitreous degeneration, bilateral: Secondary | ICD-10-CM

## 2017-04-14 DIAGNOSIS — E103512 Type 1 diabetes mellitus with proliferative diabetic retinopathy with macular edema, left eye: Secondary | ICD-10-CM | POA: Diagnosis not present

## 2017-04-14 DIAGNOSIS — E103311 Type 1 diabetes mellitus with moderate nonproliferative diabetic retinopathy with macular edema, right eye: Secondary | ICD-10-CM

## 2017-04-14 DIAGNOSIS — E10311 Type 1 diabetes mellitus with unspecified diabetic retinopathy with macular edema: Secondary | ICD-10-CM | POA: Diagnosis not present

## 2017-04-14 DIAGNOSIS — H35033 Hypertensive retinopathy, bilateral: Secondary | ICD-10-CM | POA: Diagnosis not present

## 2017-04-14 DIAGNOSIS — I10 Essential (primary) hypertension: Secondary | ICD-10-CM

## 2017-04-14 DIAGNOSIS — H2511 Age-related nuclear cataract, right eye: Secondary | ICD-10-CM

## 2017-05-26 ENCOUNTER — Encounter (INDEPENDENT_AMBULATORY_CARE_PROVIDER_SITE_OTHER): Payer: BLUE CROSS/BLUE SHIELD | Admitting: Ophthalmology

## 2017-05-26 DIAGNOSIS — E103591 Type 1 diabetes mellitus with proliferative diabetic retinopathy without macular edema, right eye: Secondary | ICD-10-CM

## 2017-05-26 DIAGNOSIS — H43813 Vitreous degeneration, bilateral: Secondary | ICD-10-CM | POA: Diagnosis not present

## 2017-05-26 DIAGNOSIS — E10311 Type 1 diabetes mellitus with unspecified diabetic retinopathy with macular edema: Secondary | ICD-10-CM | POA: Diagnosis not present

## 2017-05-26 DIAGNOSIS — I10 Essential (primary) hypertension: Secondary | ICD-10-CM | POA: Diagnosis not present

## 2017-05-26 DIAGNOSIS — H35033 Hypertensive retinopathy, bilateral: Secondary | ICD-10-CM

## 2017-05-26 DIAGNOSIS — H2511 Age-related nuclear cataract, right eye: Secondary | ICD-10-CM | POA: Diagnosis not present

## 2017-05-26 DIAGNOSIS — E103312 Type 1 diabetes mellitus with moderate nonproliferative diabetic retinopathy with macular edema, left eye: Secondary | ICD-10-CM

## 2017-06-16 DIAGNOSIS — E11311 Type 2 diabetes mellitus with unspecified diabetic retinopathy with macular edema: Secondary | ICD-10-CM | POA: Diagnosis not present

## 2017-06-16 DIAGNOSIS — E1169 Type 2 diabetes mellitus with other specified complication: Secondary | ICD-10-CM | POA: Diagnosis not present

## 2017-06-16 DIAGNOSIS — E1142 Type 2 diabetes mellitus with diabetic polyneuropathy: Secondary | ICD-10-CM | POA: Diagnosis not present

## 2017-06-16 DIAGNOSIS — E1159 Type 2 diabetes mellitus with other circulatory complications: Secondary | ICD-10-CM | POA: Diagnosis not present

## 2017-06-16 DIAGNOSIS — Z794 Long term (current) use of insulin: Secondary | ICD-10-CM | POA: Diagnosis not present

## 2017-06-30 ENCOUNTER — Encounter (INDEPENDENT_AMBULATORY_CARE_PROVIDER_SITE_OTHER): Payer: BLUE CROSS/BLUE SHIELD | Admitting: Ophthalmology

## 2017-07-06 ENCOUNTER — Encounter (INDEPENDENT_AMBULATORY_CARE_PROVIDER_SITE_OTHER): Payer: BLUE CROSS/BLUE SHIELD | Admitting: Ophthalmology

## 2017-07-06 DIAGNOSIS — E11311 Type 2 diabetes mellitus with unspecified diabetic retinopathy with macular edema: Secondary | ICD-10-CM | POA: Diagnosis not present

## 2017-07-06 DIAGNOSIS — E113311 Type 2 diabetes mellitus with moderate nonproliferative diabetic retinopathy with macular edema, right eye: Secondary | ICD-10-CM | POA: Diagnosis not present

## 2017-07-06 DIAGNOSIS — H43813 Vitreous degeneration, bilateral: Secondary | ICD-10-CM

## 2017-07-06 DIAGNOSIS — E113512 Type 2 diabetes mellitus with proliferative diabetic retinopathy with macular edema, left eye: Secondary | ICD-10-CM

## 2017-07-06 DIAGNOSIS — I10 Essential (primary) hypertension: Secondary | ICD-10-CM | POA: Diagnosis not present

## 2017-07-06 DIAGNOSIS — H35033 Hypertensive retinopathy, bilateral: Secondary | ICD-10-CM

## 2017-08-03 ENCOUNTER — Encounter (INDEPENDENT_AMBULATORY_CARE_PROVIDER_SITE_OTHER): Payer: BLUE CROSS/BLUE SHIELD | Admitting: Ophthalmology

## 2017-08-03 DIAGNOSIS — I10 Essential (primary) hypertension: Secondary | ICD-10-CM | POA: Diagnosis not present

## 2017-08-03 DIAGNOSIS — E113592 Type 2 diabetes mellitus with proliferative diabetic retinopathy without macular edema, left eye: Secondary | ICD-10-CM

## 2017-08-03 DIAGNOSIS — H35033 Hypertensive retinopathy, bilateral: Secondary | ICD-10-CM | POA: Diagnosis not present

## 2017-08-03 DIAGNOSIS — H43813 Vitreous degeneration, bilateral: Secondary | ICD-10-CM | POA: Diagnosis not present

## 2017-08-03 DIAGNOSIS — E113311 Type 2 diabetes mellitus with moderate nonproliferative diabetic retinopathy with macular edema, right eye: Secondary | ICD-10-CM

## 2017-08-03 DIAGNOSIS — E11311 Type 2 diabetes mellitus with unspecified diabetic retinopathy with macular edema: Secondary | ICD-10-CM | POA: Diagnosis not present

## 2017-09-14 ENCOUNTER — Encounter (INDEPENDENT_AMBULATORY_CARE_PROVIDER_SITE_OTHER): Payer: BLUE CROSS/BLUE SHIELD | Admitting: Ophthalmology

## 2017-09-14 DIAGNOSIS — E113311 Type 2 diabetes mellitus with moderate nonproliferative diabetic retinopathy with macular edema, right eye: Secondary | ICD-10-CM | POA: Diagnosis not present

## 2017-09-14 DIAGNOSIS — I10 Essential (primary) hypertension: Secondary | ICD-10-CM | POA: Diagnosis not present

## 2017-09-14 DIAGNOSIS — E11311 Type 2 diabetes mellitus with unspecified diabetic retinopathy with macular edema: Secondary | ICD-10-CM | POA: Diagnosis not present

## 2017-09-14 DIAGNOSIS — H35033 Hypertensive retinopathy, bilateral: Secondary | ICD-10-CM

## 2017-09-14 DIAGNOSIS — E113592 Type 2 diabetes mellitus with proliferative diabetic retinopathy without macular edema, left eye: Secondary | ICD-10-CM | POA: Diagnosis not present

## 2017-09-14 DIAGNOSIS — H43813 Vitreous degeneration, bilateral: Secondary | ICD-10-CM

## 2017-09-14 DIAGNOSIS — H2511 Age-related nuclear cataract, right eye: Secondary | ICD-10-CM

## 2017-09-15 DIAGNOSIS — H40022 Open angle with borderline findings, high risk, left eye: Secondary | ICD-10-CM | POA: Diagnosis not present

## 2017-09-15 DIAGNOSIS — H401111 Primary open-angle glaucoma, right eye, mild stage: Secondary | ICD-10-CM | POA: Diagnosis not present

## 2017-09-15 DIAGNOSIS — H40051 Ocular hypertension, right eye: Secondary | ICD-10-CM | POA: Diagnosis not present

## 2017-09-27 DIAGNOSIS — H401111 Primary open-angle glaucoma, right eye, mild stage: Secondary | ICD-10-CM | POA: Diagnosis not present

## 2017-10-19 ENCOUNTER — Encounter (INDEPENDENT_AMBULATORY_CARE_PROVIDER_SITE_OTHER): Payer: BLUE CROSS/BLUE SHIELD | Admitting: Ophthalmology

## 2017-10-19 DIAGNOSIS — I10 Essential (primary) hypertension: Secondary | ICD-10-CM | POA: Diagnosis not present

## 2017-10-19 DIAGNOSIS — E103592 Type 1 diabetes mellitus with proliferative diabetic retinopathy without macular edema, left eye: Secondary | ICD-10-CM

## 2017-10-19 DIAGNOSIS — E103211 Type 1 diabetes mellitus with mild nonproliferative diabetic retinopathy with macular edema, right eye: Secondary | ICD-10-CM

## 2017-10-19 DIAGNOSIS — H43813 Vitreous degeneration, bilateral: Secondary | ICD-10-CM

## 2017-10-19 DIAGNOSIS — E10311 Type 1 diabetes mellitus with unspecified diabetic retinopathy with macular edema: Secondary | ICD-10-CM | POA: Diagnosis not present

## 2017-10-19 DIAGNOSIS — H35033 Hypertensive retinopathy, bilateral: Secondary | ICD-10-CM

## 2017-11-23 ENCOUNTER — Encounter (INDEPENDENT_AMBULATORY_CARE_PROVIDER_SITE_OTHER): Payer: BLUE CROSS/BLUE SHIELD | Admitting: Ophthalmology

## 2017-11-23 DIAGNOSIS — E113592 Type 2 diabetes mellitus with proliferative diabetic retinopathy without macular edema, left eye: Secondary | ICD-10-CM | POA: Diagnosis not present

## 2017-11-23 DIAGNOSIS — E11311 Type 2 diabetes mellitus with unspecified diabetic retinopathy with macular edema: Secondary | ICD-10-CM

## 2017-11-23 DIAGNOSIS — I10 Essential (primary) hypertension: Secondary | ICD-10-CM | POA: Diagnosis not present

## 2017-11-23 DIAGNOSIS — E113311 Type 2 diabetes mellitus with moderate nonproliferative diabetic retinopathy with macular edema, right eye: Secondary | ICD-10-CM | POA: Diagnosis not present

## 2017-11-23 DIAGNOSIS — H43813 Vitreous degeneration, bilateral: Secondary | ICD-10-CM

## 2017-11-23 DIAGNOSIS — H35033 Hypertensive retinopathy, bilateral: Secondary | ICD-10-CM

## 2017-11-28 ENCOUNTER — Encounter: Payer: Self-pay | Admitting: Family Medicine

## 2017-11-28 ENCOUNTER — Ambulatory Visit (INDEPENDENT_AMBULATORY_CARE_PROVIDER_SITE_OTHER): Payer: BLUE CROSS/BLUE SHIELD | Admitting: Family Medicine

## 2017-11-28 VITALS — BP 114/73 | HR 78 | Temp 97.9°F | Resp 16 | Ht 76.0 in | Wt 293.0 lb

## 2017-11-28 DIAGNOSIS — Z789 Other specified health status: Secondary | ICD-10-CM | POA: Insufficient documentation

## 2017-11-28 DIAGNOSIS — Z23 Encounter for immunization: Secondary | ICD-10-CM

## 2017-11-28 DIAGNOSIS — Z125 Encounter for screening for malignant neoplasm of prostate: Secondary | ICD-10-CM

## 2017-11-28 DIAGNOSIS — M5020 Other cervical disc displacement, unspecified cervical region: Secondary | ICD-10-CM | POA: Insufficient documentation

## 2017-11-28 DIAGNOSIS — Z0000 Encounter for general adult medical examination without abnormal findings: Secondary | ICD-10-CM | POA: Diagnosis not present

## 2017-11-28 DIAGNOSIS — Z794 Long term (current) use of insulin: Secondary | ICD-10-CM

## 2017-11-28 DIAGNOSIS — E11311 Type 2 diabetes mellitus with unspecified diabetic retinopathy with macular edema: Secondary | ICD-10-CM

## 2017-11-28 DIAGNOSIS — I10 Essential (primary) hypertension: Secondary | ICD-10-CM

## 2017-11-28 LAB — POCT UA - MICROALBUMIN: MICROALBUMIN (UR) POC: 20 mg/L

## 2017-11-28 NOTE — Progress Notes (Signed)
Patient: Jimmy Mills, Male    DOB: 04-May-1965, 52 y.o.   MRN: [id number] Visit Date: 11/28/2017  Today's Provider: Mila Merry, MD   Chief Complaint  Patient presents with  . Annual Exam  . Hypertension  . Hyperlipidemia  . Irritable Bowel Syndrome   Subjective:    Annual physical exam Jimmy Mills is a 52 y.o. male who presents today for health maintenance and complete physical. He feels well. He reports exercising yes. He reports he is sleeping well.  ---------------------------------------------------------------    Diabetes Mellitus Type II, Follow-up:   Lab Results  Component Value Date   HGBA1C 6.1 10/12/2016   Last seen for diabetes 1 years ago.  Management since then includes; followed by endocrinology. He reports good compliance with treatment. He is not having side effects. none Current symptoms include none and have been unchanged. Home blood sugar records: fasting range: 90-100  Episodes of hypoglycemia? no   Current Insulin Regimen: Humalog 25 units qd Most Recent Eye Exam: 1 month ago by Dr. Elmer Picker Weight trend: stable Prior visit with dietician: no Current diet: well balanced Current exercise: aerobics, walking and weightlifting  He had labs done at [facility] through work on 10-23-2017 with A1c = 6.1. Cholesterol was well controlled and creatinine was normal.  ---------------------------------------------------------------    Hypertension, follow-up:  BP Readings from Last 3 Encounters:  11/24/16 118/74  01/01/16 123/78  11/25/15 110/78    He was last seen for hypertension 1 years ago.  BP at that visit was 118/74. Management since that visit includes; no changes.He reports good compliance with treatment. He is not having side effects. none He is exercising. He is adherent to low salt diet.   Outside blood pressures are normal. He is experiencing none.  Patient denies none.   Cardiovascular risk factors include diabetes  mellitus.  Use of agents associated with hypertension: none.   ---------------------------------------------------------------   IBS From 11/24/2016-no changes. Stable.  Wt Readings from Last 3 Encounters:  11/28/17 293 lb (132.9 kg)  11/24/16 281 lb (127.5 kg)  11/25/15 (!) 303 lb (137.4 kg)    Review of Systems  Constitutional: Negative for chills, diaphoresis and fever.  HENT: Negative for congestion, ear discharge, ear pain, hearing loss, nosebleeds, sore throat and tinnitus.   Eyes: Negative for photophobia, pain, discharge and redness.  Respiratory: Negative for cough, shortness of breath, wheezing and stridor.   Cardiovascular: Negative for chest pain, palpitations and leg swelling.  Gastrointestinal: Negative for abdominal pain, blood in stool, constipation, diarrhea, nausea and vomiting.  Endocrine: Negative for polydipsia.  Genitourinary: Negative for dysuria, flank pain, frequency, hematuria and urgency.  Musculoskeletal: Negative for back pain, myalgias and neck pain.  Skin: Negative for rash.  Allergic/Immunologic: Negative for environmental allergies.  Neurological: Negative for dizziness, tremors, seizures, weakness and headaches.  Hematological: Does not bruise/bleed easily.  Psychiatric/Behavioral: Negative for hallucinations and suicidal ideas. The patient is not nervous/anxious.     Social History      He  reports that he has never smoked. He has never used smokeless tobacco. He reports that he does not drink alcohol or use drugs.       Social History   Socioeconomic History  . Marital status: Married    Spouse name: Not on file  . Number of children: 2  . Years of education: Not on file  . Highest education level: Not on file  Occupational History    Employer: [employer]    Comment:  works as Armed forces technical officer in [employer]  Social Needs  . Financial resource strain: Not on file  . Food insecurity:    Worry: Not on file    Inability: Not on file  .  Transportation needs:    Medical: Not on file    Non-medical: Not on file  Tobacco Use  . Smoking status: Never Smoker  . Smokeless tobacco: Never Used  Substance and Sexual Activity  . Alcohol use: No  . Drug use: No  . Sexual activity: Yes  Lifestyle  . Physical activity:    Days per week: Not on file    Minutes per session: Not on file  . Stress: Not on file  Relationships  . Social connections:    Talks on phone: Not on file    Gets together: Not on file    Attends religious service: Not on file    Active member of club or organization: Not on file    Attends meetings of clubs or organizations: Not on file    Relationship status: Not on file  Other Topics Concern  . Not on file  Social History Narrative  . Not on file    Past Medical History:  Diagnosis Date  . History of chicken pox 12/30/2014   DID have Chicken Pox. DID have Mumps.       Patient Active Problem List   Diagnosis Date Noted  . 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 Constipation 12/30/2014  . Dermatitis, eczematoid 12/30/2014  . Cervical pain 12/30/2014  . Obesity 12/30/2014  . Pure hypercholesterolemia 12/30/2014  . Tenosynovitis of hip 12/30/2014  . Chronic LBP 12/21/2012  . Lumbar radiculopathy 12/21/2012  . Retinopathy 02/11/2011  . Family history of malignant neoplasm of prostate 06/09/2009  . Irritable bowel syndrome 04/22/2009  . Abdominal pain 10/09/2007  . Diabetic macular edema (HCC) 10/12/2006  . Back strain 10/04/2006  . 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 Essential hypertension 03/28/2002    Past Surgical History:  Procedure Laterality Date  . BACK SURGERY    . CATARACT EXTRACTION Left 06/2014  . CERVICAL FUSION  11/10/2014  . SHOULDER ARTHROSCOPY Right     Family History        Family Status  Relation Name Status  . Mother  Alive  . Father  Deceased  .  Sister  Alive  . Daughter  Alive  . Son  Alive  . Sister  Alive        His family history includes Cancer in his father; Dementia in his father; Diabetes in his mother; Glaucoma in his mother.      Allergies  Allergen Reactions  . Doxycycline Rash     Current Outpatient Medications:  .  amLODipine (NORVASC) 5 MG tablet, Take 5 mg by mouth daily., Disp: , Rfl:  .  dicyclomine (BENTYL) 20 MG tablet, , Disp: , Rfl:  .  insulin lispro protamine-lispro (HUMALOG 75/25 MIX) (75-25) 100 UNIT/ML SUSP injection, Inject 25 Units into the skin 2 (two) times daily with a meal., Disp: , Rfl:  .  losartan (COZAAR) 100 MG tablet, Take 100 mg by mouth daily., Disp: , Rfl:  .  ONE TOUCH ULTRA TEST test strip, , Disp: , Rfl:  .  rosuvastatin (CRESTOR) 5 MG tablet, Take 10 mg by mouth 3 (three) times a week., Disp: , Rfl:  .  SitaGLIPtin-MetFORMIN HCl 50-1000 MG TB24, Take by  mouth daily., Disp: , Rfl:  .  Ibuprofen-Famotidine (DUEXIS) 800-26.6 MG TABS, Take 1 tablet by mouth 3 (three) times daily. (Patient not taking: Reported on 11/28/2017), Disp: 90 tablet, Rfl: 1 .  Liraglutide (VICTOZA) 18 MG/3ML SOPN, Inject 1.8 mg into the skin daily., Disp: , Rfl:    Patient Care Team: Malva Limes, MD as PCP - General (Family Medicine) Laurena Slimmer, MD as Consulting Physician (Endocrinology) Sherrie George, MD as Consulting Physician (Ophthalmology)      Objective:   Vitals: BP 114/73 (BP Location: Right Arm, Patient Position: Sitting, Cuff Size: Large)   Pulse 78   Temp 97.9 F (36.6 C) (Oral)   Resp 16   Ht 6\' 4"  (1.93 m)   Wt 293 lb (132.9 kg)   SpO2 99%   BMI 35.67 kg/m      Physical Exam   General Appearance:    Alert, cooperative, no distress, appears stated age  Head:    Normocephalic, without obvious abnormality, atraumatic  Eyes:    PERRL, conjunctiva/corneas clear, EOM's intact, fundi    benign, both eyes       Ears:    Normal TM's and external ear canals, both ears  Nose:    Nares normal, septum midline, mucosa normal, no drainage   or sinus tenderness  Throat:   Lips, mucosa, and tongue normal; teeth and gums normal  Neck:   Supple, symmetrical, trachea midline, no adenopathy;       thyroid:  No enlargement/tenderness/nodules; no carotid   bruit or JVD  Back:     Symmetric, no curvature, ROM normal, no CVA tenderness  Lungs:     Clear to auscultation bilaterally, respirations unlabored  Chest wall:    No tenderness or deformity  Heart:    Regular rate and rhythm, S1 and S2 normal, no murmur, rub   or gallop  Abdomen:     Soft, non-tender, bowel sounds active all four quadrants,    no masses, no organomegaly  Genitalia:    deferred  Rectal:    deferred  Extremities:   Extremities normal, atraumatic, no cyanosis or edema  Pulses:   2+ and symmetric all extremities  Skin:   Skin color, texture, turgor normal, no rashes or lesions  Lymph nodes:   Cervical, supraclavicular, and axillary nodes normal  Neurologic:   CNII-XII intact. Normal strength, sensation and reflexes      throughout    Depression Screen PHQ 2/9 Scores 11/24/2016 11/25/2015  PHQ - 2 Score 0 0  PHQ- 9 Score - 3      Assessment & Plan:     Routine Health Maintenance and Physical Exam  Exercise Activities and Dietary recommendations Goals   None     Immunization History  Administered Date(s) Administered  . Influenza,inj,Quad PF,6+ Mos 11/24/2016  . Influenza-Unspecified 12/27/2014  . Pneumococcal Polysaccharide-23 02/24/2011  . Tdap 02/24/2011    Health Maintenance  Topic Date Due  . FOOT EXAM  11/15/1974  . HIV Screening  11/15/1979  . OPHTHALMOLOGY EXAM  07/25/2015  . HEMOGLOBIN A1C  04/14/2017  . INFLUENZA VACCINE  10/26/2017  . COLONOSCOPY  02/17/2019  . TETANUS/TDAP  02/23/2021  . PNEUMOCOCCAL POLYSACCHARIDE VACCINE AGE 6-64 HIGH RISK  Completed     Discussed health benefits of physical activity, and encouraged him to engage in regular exercise appropriate  for his age and condition.    -------------------------------------------------------------------- 1. Annual physical exam Doing well. Unremarkable exam.  - Comprehensive metabolic panel - PSA  2. 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  Follow up Dr. Gershon Crane as scheduled. Later this month.  - POCT UA - Microalbumin - Comprehensive metabolic panel  3. Essential hypertension Well controlled.  Continue current medications.   - EKG 12-Lead - Comprehensive metabolic panel  4. Prostate cancer screening  - PSA  5. Need for influenza vaccination  - Flu Vaccine QUAD 36+ mos IM  6. Need for shingles vaccine Shingrix #1. Repeat in 2 months.     Mila Merry, MD  [facility]

## 2017-11-29 LAB — COMPREHENSIVE METABOLIC PANEL
A/G RATIO: 1.8 (ref 1.2–2.2)
ALT: 27 IU/L (ref 0–44)
AST: 33 IU/L (ref 0–40)
Albumin: 4.8 g/dL (ref 3.5–5.5)
Alkaline Phosphatase: 53 IU/L (ref 39–117)
BILIRUBIN TOTAL: 1 mg/dL (ref 0.0–1.2)
BUN/Creatinine Ratio: 14 (ref 9–20)
BUN: 14 mg/dL (ref 6–24)
CALCIUM: 9.8 mg/dL (ref 8.7–10.2)
CHLORIDE: 103 mmol/L (ref 96–106)
CO2: 27 mmol/L (ref 20–29)
Creatinine, Ser: 1.03 mg/dL (ref 0.76–1.27)
GFR calc Af Amer: 95 mL/min/{1.73_m2} (ref >59)
GFR calc non Af Amer: 83 mL/min/{1.73_m2} (ref >59)
GLUCOSE: 66 mg/dL (ref 65–99)
Globulin, Total: 2.6 g/dL (ref 1.5–4.5)
POTASSIUM: 4.5 mmol/L (ref 3.5–5.2)
Sodium: 144 mmol/L (ref 134–144)
TOTAL PROTEIN: 7.4 g/dL (ref 6.0–8.5)

## 2017-11-29 LAB — PSA: PROSTATE SPECIFIC AG, SERUM: 0.5 ng/mL (ref 0.0–4.0)

## 2017-12-12 DIAGNOSIS — E1169 Type 2 diabetes mellitus with other specified complication: Secondary | ICD-10-CM | POA: Diagnosis not present

## 2017-12-12 DIAGNOSIS — E1159 Type 2 diabetes mellitus with other circulatory complications: Secondary | ICD-10-CM | POA: Diagnosis not present

## 2017-12-12 DIAGNOSIS — Z794 Long term (current) use of insulin: Secondary | ICD-10-CM | POA: Diagnosis not present

## 2017-12-12 DIAGNOSIS — E1142 Type 2 diabetes mellitus with diabetic polyneuropathy: Secondary | ICD-10-CM | POA: Diagnosis not present

## 2017-12-29 ENCOUNTER — Encounter (INDEPENDENT_AMBULATORY_CARE_PROVIDER_SITE_OTHER): Payer: BLUE CROSS/BLUE SHIELD | Admitting: Ophthalmology

## 2017-12-29 DIAGNOSIS — I10 Essential (primary) hypertension: Secondary | ICD-10-CM | POA: Diagnosis not present

## 2017-12-29 DIAGNOSIS — H35033 Hypertensive retinopathy, bilateral: Secondary | ICD-10-CM

## 2017-12-29 DIAGNOSIS — E103592 Type 1 diabetes mellitus with proliferative diabetic retinopathy without macular edema, left eye: Secondary | ICD-10-CM

## 2017-12-29 DIAGNOSIS — E103311 Type 1 diabetes mellitus with moderate nonproliferative diabetic retinopathy with macular edema, right eye: Secondary | ICD-10-CM | POA: Diagnosis not present

## 2017-12-29 DIAGNOSIS — E10311 Type 1 diabetes mellitus with unspecified diabetic retinopathy with macular edema: Secondary | ICD-10-CM

## 2017-12-29 DIAGNOSIS — H43813 Vitreous degeneration, bilateral: Secondary | ICD-10-CM

## 2017-12-29 DIAGNOSIS — H2511 Age-related nuclear cataract, right eye: Secondary | ICD-10-CM

## 2018-01-18 DIAGNOSIS — H40041 Steroid responder, right eye: Secondary | ICD-10-CM | POA: Diagnosis not present

## 2018-01-18 DIAGNOSIS — H25011 Cortical age-related cataract, right eye: Secondary | ICD-10-CM | POA: Diagnosis not present

## 2018-01-18 DIAGNOSIS — H2511 Age-related nuclear cataract, right eye: Secondary | ICD-10-CM | POA: Diagnosis not present

## 2018-01-18 DIAGNOSIS — H40022 Open angle with borderline findings, high risk, left eye: Secondary | ICD-10-CM | POA: Diagnosis not present

## 2018-01-18 DIAGNOSIS — E113311 Type 2 diabetes mellitus with moderate nonproliferative diabetic retinopathy with macular edema, right eye: Secondary | ICD-10-CM | POA: Diagnosis not present

## 2018-01-18 DIAGNOSIS — H35033 Hypertensive retinopathy, bilateral: Secondary | ICD-10-CM | POA: Diagnosis not present

## 2018-01-24 DIAGNOSIS — H40051 Ocular hypertension, right eye: Secondary | ICD-10-CM | POA: Diagnosis not present

## 2018-01-24 DIAGNOSIS — H40041 Steroid responder, right eye: Secondary | ICD-10-CM | POA: Diagnosis not present

## 2018-01-24 DIAGNOSIS — H40022 Open angle with borderline findings, high risk, left eye: Secondary | ICD-10-CM | POA: Diagnosis not present

## 2018-01-26 ENCOUNTER — Encounter (INDEPENDENT_AMBULATORY_CARE_PROVIDER_SITE_OTHER): Payer: BLUE CROSS/BLUE SHIELD | Admitting: Ophthalmology

## 2018-01-26 DIAGNOSIS — H35033 Hypertensive retinopathy, bilateral: Secondary | ICD-10-CM

## 2018-01-26 DIAGNOSIS — I10 Essential (primary) hypertension: Secondary | ICD-10-CM | POA: Diagnosis not present

## 2018-01-26 DIAGNOSIS — H2511 Age-related nuclear cataract, right eye: Secondary | ICD-10-CM

## 2018-01-26 DIAGNOSIS — E113311 Type 2 diabetes mellitus with moderate nonproliferative diabetic retinopathy with macular edema, right eye: Secondary | ICD-10-CM

## 2018-01-26 DIAGNOSIS — E113592 Type 2 diabetes mellitus with proliferative diabetic retinopathy without macular edema, left eye: Secondary | ICD-10-CM | POA: Diagnosis not present

## 2018-01-26 DIAGNOSIS — E11311 Type 2 diabetes mellitus with unspecified diabetic retinopathy with macular edema: Secondary | ICD-10-CM

## 2018-01-26 DIAGNOSIS — H43813 Vitreous degeneration, bilateral: Secondary | ICD-10-CM

## 2018-01-29 ENCOUNTER — Ambulatory Visit (INDEPENDENT_AMBULATORY_CARE_PROVIDER_SITE_OTHER): Payer: BLUE CROSS/BLUE SHIELD | Admitting: Family Medicine

## 2018-01-29 DIAGNOSIS — Z23 Encounter for immunization: Secondary | ICD-10-CM | POA: Diagnosis not present

## 2018-01-29 NOTE — Progress Notes (Signed)
Nurse visit only- Administered Shingrix #2 vaccine

## 2018-02-21 ENCOUNTER — Encounter (INDEPENDENT_AMBULATORY_CARE_PROVIDER_SITE_OTHER): Payer: BLUE CROSS/BLUE SHIELD | Admitting: Ophthalmology

## 2018-02-21 DIAGNOSIS — E103592 Type 1 diabetes mellitus with proliferative diabetic retinopathy without macular edema, left eye: Secondary | ICD-10-CM | POA: Diagnosis not present

## 2018-02-21 DIAGNOSIS — H2511 Age-related nuclear cataract, right eye: Secondary | ICD-10-CM

## 2018-02-21 DIAGNOSIS — I10 Essential (primary) hypertension: Secondary | ICD-10-CM

## 2018-02-21 DIAGNOSIS — E103311 Type 1 diabetes mellitus with moderate nonproliferative diabetic retinopathy with macular edema, right eye: Secondary | ICD-10-CM | POA: Diagnosis not present

## 2018-02-21 DIAGNOSIS — H43813 Vitreous degeneration, bilateral: Secondary | ICD-10-CM

## 2018-02-21 DIAGNOSIS — E10311 Type 1 diabetes mellitus with unspecified diabetic retinopathy with macular edema: Secondary | ICD-10-CM

## 2018-02-21 DIAGNOSIS — H35033 Hypertensive retinopathy, bilateral: Secondary | ICD-10-CM

## 2018-03-06 DIAGNOSIS — H2511 Age-related nuclear cataract, right eye: Secondary | ICD-10-CM | POA: Diagnosis not present

## 2018-03-06 DIAGNOSIS — H25811 Combined forms of age-related cataract, right eye: Secondary | ICD-10-CM | POA: Diagnosis not present

## 2018-03-07 DIAGNOSIS — E1169 Type 2 diabetes mellitus with other specified complication: Secondary | ICD-10-CM | POA: Diagnosis not present

## 2018-03-07 DIAGNOSIS — E785 Hyperlipidemia, unspecified: Secondary | ICD-10-CM | POA: Diagnosis not present

## 2018-03-14 DIAGNOSIS — E1159 Type 2 diabetes mellitus with other circulatory complications: Secondary | ICD-10-CM | POA: Diagnosis not present

## 2018-03-14 DIAGNOSIS — E1169 Type 2 diabetes mellitus with other specified complication: Secondary | ICD-10-CM | POA: Diagnosis not present

## 2018-03-14 DIAGNOSIS — E1142 Type 2 diabetes mellitus with diabetic polyneuropathy: Secondary | ICD-10-CM | POA: Diagnosis not present

## 2018-03-14 DIAGNOSIS — Z794 Long term (current) use of insulin: Secondary | ICD-10-CM | POA: Diagnosis not present

## 2018-03-19 ENCOUNTER — Encounter (INDEPENDENT_AMBULATORY_CARE_PROVIDER_SITE_OTHER): Payer: BLUE CROSS/BLUE SHIELD | Admitting: Ophthalmology

## 2018-05-03 DIAGNOSIS — H5711 Ocular pain, right eye: Secondary | ICD-10-CM | POA: Diagnosis not present

## 2018-05-03 DIAGNOSIS — H2000 Unspecified acute and subacute iridocyclitis: Secondary | ICD-10-CM | POA: Diagnosis not present

## 2018-05-25 ENCOUNTER — Encounter (INDEPENDENT_AMBULATORY_CARE_PROVIDER_SITE_OTHER): Payer: BLUE CROSS/BLUE SHIELD | Admitting: Ophthalmology

## 2018-06-01 ENCOUNTER — Encounter (INDEPENDENT_AMBULATORY_CARE_PROVIDER_SITE_OTHER): Payer: BLUE CROSS/BLUE SHIELD | Admitting: Ophthalmology

## 2018-06-01 DIAGNOSIS — I10 Essential (primary) hypertension: Secondary | ICD-10-CM

## 2018-06-01 DIAGNOSIS — H35033 Hypertensive retinopathy, bilateral: Secondary | ICD-10-CM

## 2018-06-01 DIAGNOSIS — E103592 Type 1 diabetes mellitus with proliferative diabetic retinopathy without macular edema, left eye: Secondary | ICD-10-CM | POA: Diagnosis not present

## 2018-06-01 DIAGNOSIS — E10311 Type 1 diabetes mellitus with unspecified diabetic retinopathy with macular edema: Secondary | ICD-10-CM

## 2018-06-01 DIAGNOSIS — E103311 Type 1 diabetes mellitus with moderate nonproliferative diabetic retinopathy with macular edema, right eye: Secondary | ICD-10-CM | POA: Diagnosis not present

## 2018-06-01 DIAGNOSIS — H43813 Vitreous degeneration, bilateral: Secondary | ICD-10-CM

## 2018-06-04 ENCOUNTER — Encounter (INDEPENDENT_AMBULATORY_CARE_PROVIDER_SITE_OTHER): Payer: BLUE CROSS/BLUE SHIELD | Admitting: Ophthalmology

## 2018-06-29 ENCOUNTER — Encounter (INDEPENDENT_AMBULATORY_CARE_PROVIDER_SITE_OTHER): Payer: BLUE CROSS/BLUE SHIELD | Admitting: Ophthalmology

## 2018-06-29 ENCOUNTER — Other Ambulatory Visit: Payer: Self-pay

## 2018-06-29 DIAGNOSIS — H35033 Hypertensive retinopathy, bilateral: Secondary | ICD-10-CM

## 2018-06-29 DIAGNOSIS — E103592 Type 1 diabetes mellitus with proliferative diabetic retinopathy without macular edema, left eye: Secondary | ICD-10-CM | POA: Diagnosis not present

## 2018-06-29 DIAGNOSIS — E103311 Type 1 diabetes mellitus with moderate nonproliferative diabetic retinopathy with macular edema, right eye: Secondary | ICD-10-CM

## 2018-06-29 DIAGNOSIS — I10 Essential (primary) hypertension: Secondary | ICD-10-CM

## 2018-06-29 DIAGNOSIS — E10311 Type 1 diabetes mellitus with unspecified diabetic retinopathy with macular edema: Secondary | ICD-10-CM

## 2018-06-29 DIAGNOSIS — H43813 Vitreous degeneration, bilateral: Secondary | ICD-10-CM

## 2018-07-13 DIAGNOSIS — Z794 Long term (current) use of insulin: Secondary | ICD-10-CM | POA: Diagnosis not present

## 2018-07-13 DIAGNOSIS — E1142 Type 2 diabetes mellitus with diabetic polyneuropathy: Secondary | ICD-10-CM | POA: Diagnosis not present

## 2018-07-25 DIAGNOSIS — E1159 Type 2 diabetes mellitus with other circulatory complications: Secondary | ICD-10-CM | POA: Diagnosis not present

## 2018-07-25 DIAGNOSIS — Z794 Long term (current) use of insulin: Secondary | ICD-10-CM | POA: Diagnosis not present

## 2018-07-25 DIAGNOSIS — E1142 Type 2 diabetes mellitus with diabetic polyneuropathy: Secondary | ICD-10-CM | POA: Diagnosis not present

## 2018-07-25 DIAGNOSIS — E1169 Type 2 diabetes mellitus with other specified complication: Secondary | ICD-10-CM | POA: Diagnosis not present

## 2018-07-27 ENCOUNTER — Other Ambulatory Visit: Payer: Self-pay

## 2018-07-27 ENCOUNTER — Encounter (INDEPENDENT_AMBULATORY_CARE_PROVIDER_SITE_OTHER): Payer: BLUE CROSS/BLUE SHIELD | Admitting: Ophthalmology

## 2018-07-27 DIAGNOSIS — E113512 Type 2 diabetes mellitus with proliferative diabetic retinopathy with macular edema, left eye: Secondary | ICD-10-CM

## 2018-07-27 DIAGNOSIS — H35033 Hypertensive retinopathy, bilateral: Secondary | ICD-10-CM

## 2018-07-27 DIAGNOSIS — E11311 Type 2 diabetes mellitus with unspecified diabetic retinopathy with macular edema: Secondary | ICD-10-CM | POA: Diagnosis not present

## 2018-07-27 DIAGNOSIS — I10 Essential (primary) hypertension: Secondary | ICD-10-CM

## 2018-07-27 DIAGNOSIS — E113311 Type 2 diabetes mellitus with moderate nonproliferative diabetic retinopathy with macular edema, right eye: Secondary | ICD-10-CM

## 2018-08-31 ENCOUNTER — Other Ambulatory Visit: Payer: Self-pay

## 2018-08-31 ENCOUNTER — Encounter (INDEPENDENT_AMBULATORY_CARE_PROVIDER_SITE_OTHER): Payer: BLUE CROSS/BLUE SHIELD | Admitting: Ophthalmology

## 2018-08-31 DIAGNOSIS — E103311 Type 1 diabetes mellitus with moderate nonproliferative diabetic retinopathy with macular edema, right eye: Secondary | ICD-10-CM | POA: Diagnosis not present

## 2018-08-31 DIAGNOSIS — E103592 Type 1 diabetes mellitus with proliferative diabetic retinopathy without macular edema, left eye: Secondary | ICD-10-CM | POA: Diagnosis not present

## 2018-08-31 DIAGNOSIS — I10 Essential (primary) hypertension: Secondary | ICD-10-CM

## 2018-08-31 DIAGNOSIS — H43813 Vitreous degeneration, bilateral: Secondary | ICD-10-CM

## 2018-08-31 DIAGNOSIS — H35033 Hypertensive retinopathy, bilateral: Secondary | ICD-10-CM

## 2018-08-31 DIAGNOSIS — E10311 Type 1 diabetes mellitus with unspecified diabetic retinopathy with macular edema: Secondary | ICD-10-CM | POA: Diagnosis not present

## 2018-10-05 ENCOUNTER — Encounter (INDEPENDENT_AMBULATORY_CARE_PROVIDER_SITE_OTHER): Payer: BC Managed Care – PPO | Admitting: Ophthalmology

## 2018-10-05 ENCOUNTER — Other Ambulatory Visit: Payer: Self-pay

## 2018-10-05 DIAGNOSIS — E103592 Type 1 diabetes mellitus with proliferative diabetic retinopathy without macular edema, left eye: Secondary | ICD-10-CM | POA: Diagnosis not present

## 2018-10-05 DIAGNOSIS — I10 Essential (primary) hypertension: Secondary | ICD-10-CM

## 2018-10-05 DIAGNOSIS — E103311 Type 1 diabetes mellitus with moderate nonproliferative diabetic retinopathy with macular edema, right eye: Secondary | ICD-10-CM

## 2018-10-05 DIAGNOSIS — E10311 Type 1 diabetes mellitus with unspecified diabetic retinopathy with macular edema: Secondary | ICD-10-CM

## 2018-10-05 DIAGNOSIS — H43813 Vitreous degeneration, bilateral: Secondary | ICD-10-CM

## 2018-10-05 DIAGNOSIS — H35033 Hypertensive retinopathy, bilateral: Secondary | ICD-10-CM

## 2018-11-08 ENCOUNTER — Encounter (INDEPENDENT_AMBULATORY_CARE_PROVIDER_SITE_OTHER): Payer: BC Managed Care – PPO | Admitting: Ophthalmology

## 2018-11-08 ENCOUNTER — Other Ambulatory Visit: Payer: Self-pay

## 2018-11-08 DIAGNOSIS — E103311 Type 1 diabetes mellitus with moderate nonproliferative diabetic retinopathy with macular edema, right eye: Secondary | ICD-10-CM | POA: Diagnosis not present

## 2018-11-08 DIAGNOSIS — E103592 Type 1 diabetes mellitus with proliferative diabetic retinopathy without macular edema, left eye: Secondary | ICD-10-CM

## 2018-11-08 DIAGNOSIS — E10319 Type 1 diabetes mellitus with unspecified diabetic retinopathy without macular edema: Secondary | ICD-10-CM

## 2018-11-08 DIAGNOSIS — Z03818 Encounter for observation for suspected exposure to other biological agents ruled out: Secondary | ICD-10-CM | POA: Diagnosis not present

## 2018-11-08 DIAGNOSIS — I10 Essential (primary) hypertension: Secondary | ICD-10-CM | POA: Diagnosis not present

## 2018-11-08 DIAGNOSIS — H35033 Hypertensive retinopathy, bilateral: Secondary | ICD-10-CM

## 2018-11-08 DIAGNOSIS — H43813 Vitreous degeneration, bilateral: Secondary | ICD-10-CM

## 2018-11-09 ENCOUNTER — Encounter (INDEPENDENT_AMBULATORY_CARE_PROVIDER_SITE_OTHER): Payer: BC Managed Care – PPO | Admitting: Ophthalmology

## 2018-12-21 ENCOUNTER — Other Ambulatory Visit: Payer: Self-pay

## 2018-12-21 ENCOUNTER — Encounter (INDEPENDENT_AMBULATORY_CARE_PROVIDER_SITE_OTHER): Payer: BC Managed Care – PPO | Admitting: Ophthalmology

## 2018-12-21 DIAGNOSIS — H35033 Hypertensive retinopathy, bilateral: Secondary | ICD-10-CM

## 2018-12-21 DIAGNOSIS — E11311 Type 2 diabetes mellitus with unspecified diabetic retinopathy with macular edema: Secondary | ICD-10-CM | POA: Diagnosis not present

## 2018-12-21 DIAGNOSIS — E113311 Type 2 diabetes mellitus with moderate nonproliferative diabetic retinopathy with macular edema, right eye: Secondary | ICD-10-CM | POA: Diagnosis not present

## 2018-12-21 DIAGNOSIS — E113592 Type 2 diabetes mellitus with proliferative diabetic retinopathy without macular edema, left eye: Secondary | ICD-10-CM | POA: Diagnosis not present

## 2018-12-21 DIAGNOSIS — I10 Essential (primary) hypertension: Secondary | ICD-10-CM

## 2018-12-21 DIAGNOSIS — H43813 Vitreous degeneration, bilateral: Secondary | ICD-10-CM

## 2019-01-16 DIAGNOSIS — E1159 Type 2 diabetes mellitus with other circulatory complications: Secondary | ICD-10-CM | POA: Diagnosis not present

## 2019-01-16 DIAGNOSIS — E1142 Type 2 diabetes mellitus with diabetic polyneuropathy: Secondary | ICD-10-CM | POA: Diagnosis not present

## 2019-01-16 DIAGNOSIS — E785 Hyperlipidemia, unspecified: Secondary | ICD-10-CM | POA: Diagnosis not present

## 2019-01-16 DIAGNOSIS — E1169 Type 2 diabetes mellitus with other specified complication: Secondary | ICD-10-CM | POA: Diagnosis not present

## 2019-01-16 DIAGNOSIS — Z794 Long term (current) use of insulin: Secondary | ICD-10-CM | POA: Diagnosis not present

## 2019-01-16 DIAGNOSIS — I10 Essential (primary) hypertension: Secondary | ICD-10-CM | POA: Diagnosis not present

## 2019-01-16 LAB — HEMOGLOBIN A1C: Hemoglobin A1C: 6.5

## 2019-01-16 LAB — MICROALBUMIN, URINE: Microalb, Ur: 6

## 2019-01-24 DIAGNOSIS — E1159 Type 2 diabetes mellitus with other circulatory complications: Secondary | ICD-10-CM | POA: Diagnosis not present

## 2019-01-24 DIAGNOSIS — Z794 Long term (current) use of insulin: Secondary | ICD-10-CM | POA: Diagnosis not present

## 2019-01-24 DIAGNOSIS — E1142 Type 2 diabetes mellitus with diabetic polyneuropathy: Secondary | ICD-10-CM | POA: Diagnosis not present

## 2019-01-24 DIAGNOSIS — E1169 Type 2 diabetes mellitus with other specified complication: Secondary | ICD-10-CM | POA: Diagnosis not present

## 2019-02-01 ENCOUNTER — Encounter (INDEPENDENT_AMBULATORY_CARE_PROVIDER_SITE_OTHER): Payer: BC Managed Care – PPO | Admitting: Ophthalmology

## 2019-02-01 DIAGNOSIS — H35033 Hypertensive retinopathy, bilateral: Secondary | ICD-10-CM

## 2019-02-01 DIAGNOSIS — E103512 Type 1 diabetes mellitus with proliferative diabetic retinopathy with macular edema, left eye: Secondary | ICD-10-CM

## 2019-02-01 DIAGNOSIS — E103311 Type 1 diabetes mellitus with moderate nonproliferative diabetic retinopathy with macular edema, right eye: Secondary | ICD-10-CM

## 2019-02-01 DIAGNOSIS — E10311 Type 1 diabetes mellitus with unspecified diabetic retinopathy with macular edema: Secondary | ICD-10-CM | POA: Diagnosis not present

## 2019-02-01 DIAGNOSIS — I10 Essential (primary) hypertension: Secondary | ICD-10-CM

## 2019-02-01 DIAGNOSIS — H43813 Vitreous degeneration, bilateral: Secondary | ICD-10-CM

## 2019-02-25 ENCOUNTER — Other Ambulatory Visit: Payer: Self-pay

## 2019-02-25 ENCOUNTER — Ambulatory Visit (INDEPENDENT_AMBULATORY_CARE_PROVIDER_SITE_OTHER): Payer: BC Managed Care – PPO | Admitting: Family Medicine

## 2019-02-25 ENCOUNTER — Encounter: Payer: Self-pay | Admitting: Family Medicine

## 2019-02-25 VITALS — BP 100/68 | HR 71 | Temp 96.6°F | Resp 16 | Ht 76.0 in | Wt 237.0 lb

## 2019-02-25 DIAGNOSIS — Z125 Encounter for screening for malignant neoplasm of prostate: Secondary | ICD-10-CM

## 2019-02-25 DIAGNOSIS — Z1211 Encounter for screening for malignant neoplasm of colon: Secondary | ICD-10-CM

## 2019-02-25 DIAGNOSIS — H3500 Unspecified background retinopathy: Secondary | ICD-10-CM

## 2019-02-25 DIAGNOSIS — E11311 Type 2 diabetes mellitus with unspecified diabetic retinopathy with macular edema: Secondary | ICD-10-CM

## 2019-02-25 DIAGNOSIS — Z0000 Encounter for general adult medical examination without abnormal findings: Secondary | ICD-10-CM

## 2019-02-25 DIAGNOSIS — Z794 Long term (current) use of insulin: Secondary | ICD-10-CM

## 2019-02-25 NOTE — Progress Notes (Signed)
Patient: Jimmy Mills, Male    DOB: 1965/06/16, 53 y.o.   MRN: [id number] Visit Date: 02/25/2019  Today's Provider: Mila Merry, MD   Chief Complaint  Patient presents with  . Annual Exam   Subjective:     Annual physical exam Jimmy Mills is a 53 y.o. male who presents today for health maintenance and complete physical. He feels fairly well. He reports exercising daily. He reports he is sleeping fairly well.  -----------------------------------------------------------------   Hypertension, follow-up:  BP Readings from Last 3 Encounters:  02/25/19 100/68  11/28/17 114/73  11/24/16 118/74    He was last seen for hypertension 1 years ago.  BP at that visit was 114/73. Management since that visit includes no changes. He reports good compliance with treatment. He is not having side effects.  He is exercising. He is adherent to low salt diet.   Outside blood pressures are not checked. He is experiencing none.  Patient denies chest pain, chest pressure/discomfort, claudication, dyspnea, exertional chest pressure/discomfort, fatigue, irregular heart beat, lower extremity edema, near-syncope, orthopnea, palpitations, paroxysmal nocturnal dyspnea, syncope and tachypnea.   Cardiovascular risk factors include diabetes mellitus, hypertension and male gender.  Use of agents associated with hypertension: none.     Weight trend: decreasing steadily Wt Readings from Last 3 Encounters:  02/25/19 237 lb (107.5 kg)  11/28/17 293 lb (132.9 kg)  11/24/16 281 lb (127.5 kg)    Current diet: in general, a "healthy" diet    ------------------------------------------------------------------------  Follow up for Diabetes:  The patient was last seen for this 1 years ago. Changes made at last visit include none. This problem is managed by Endocrinology. Last appointment with Endocrinologist was 01/24/2019. Reviewed labs including a1c, lipids and urine micral He reports  good compliance with treatment. He feels that condition is Improved. He is not having side effects.   ------------------------------------------------------------------------------------  Review of Systems  Constitutional: Negative for appetite change, chills, fatigue and fever.  HENT: Negative for congestion, ear pain, hearing loss, nosebleeds and trouble swallowing.   Eyes: Negative for pain and visual disturbance.  Respiratory: Negative for cough, chest tightness and shortness of breath.   Cardiovascular: Negative for chest pain, palpitations and leg swelling.  Gastrointestinal: Positive for constipation. Negative for abdominal pain, blood in stool, diarrhea, nausea and vomiting.  Endocrine: Negative for polydipsia, polyphagia and polyuria.  Genitourinary: Negative for dysuria and flank pain.  Musculoskeletal: Negative for arthralgias, back pain, joint swelling, myalgias and neck stiffness.  Skin: Negative for color change, rash and wound.  Neurological: Negative for dizziness, tremors, seizures, speech difficulty, weakness, light-headedness and headaches.  Psychiatric/Behavioral: Negative for behavioral problems, confusion, decreased concentration, dysphoric mood and sleep disturbance. The patient is not nervous/anxious.   All other systems reviewed and are negative.   Social History      He  reports that he has never smoked. He has never used smokeless tobacco. He reports that he does not drink alcohol or use drugs.       Social History   Socioeconomic History  . Marital status: Married    Spouse name: Not on file  . Number of children: 2  . Years of education: Not on file  . 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 Financial resource strain: Not on file  . Food insecurity    Worry: Not on file  Inability: Not on file  . Transportation needs    Medical: Not on file     Non-medical: Not on file  Tobacco Use  . Smoking status: Never Smoker  . Smokeless tobacco: Never Used  Substance and Sexual Activity  . Alcohol use: No  . Drug use: No  . Sexual activity: Yes  Lifestyle  . Physical activity    Days per week: Not on file    Minutes per session: Not on file  . Stress: Not on file  Relationships  . Social Musician on phone: Not on file    Gets together: Not on file    Attends religious service: Not on file    Active member of club or organization: Not on file    Attends meetings of clubs or organizations: Not on file    Relationship status: Not on file  Other Topics Concern  . Not on file  Social History Narrative  . Not on file    Past Medical History:  Diagnosis Date  . History of chicken pox 12/30/2014   DID have Chicken Pox. DID have Mumps.       Patient Active Problem List   Diagnosis Date Noted  . 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 Immune to measles 11/28/2017  . Constipation 12/30/2014  . Dermatitis, eczematoid 12/30/2014  . Cervical pain 12/30/2014  . Obesity 12/30/2014  . Pure hypercholesterolemia 12/30/2014  . Tenosynovitis of hip 12/30/2014  . Chronic LBP 12/21/2012  . Lumbar radiculopathy 12/21/2012  . Retinopathy 02/11/2011  . Family history of malignant neoplasm of prostate 06/09/2009  . Irritable bowel syndrome 04/22/2009  . Abdominal pain 10/09/2007  . Diabetic macular edema (HCC) 10/12/2006  . Back strain 10/04/2006  . 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 Essential hypertension 03/28/2002    Past Surgical History:  Procedure Laterality Date  . BACK SURGERY    . CATARACT EXTRACTION Left 06/2014  . CERVICAL FUSION  11/10/2014  . SHOULDER ARTHROSCOPY Right     Family History        Family Status  Relation Name Status  . Mother  Deceased  . Father  Deceased  . Sister  Alive   . Daughter  Alive  . Son  Alive  . Sister  Alive        His family history includes Cancer in his father; Dementia in his father; Diabetes in his mother; Glaucoma in his mother; Liver cancer in his mother; Pancreatic cancer in his mother.      Allergies  Allergen Reactions  . Doxycycline Rash     Current Outpatient Medications:  .  amLODipine (NORVASC) 5 MG tablet, Take 5 mg by mouth daily., Disp: , Rfl:  .  dicyclomine (BENTYL) 20 MG tablet, , Disp: , Rfl:  .  Ibuprofen-Famotidine (DUEXIS) 800-26.6 MG TABS, Take 1 tablet by mouth 3 (three) times daily., Disp: 90 tablet, Rfl: 1 .  JARDIANCE 25 MG TABS tablet, Take 25 mg by mouth daily., Disp: , Rfl:  .  losartan (COZAAR) 100 MG tablet, Take 100 mg by mouth daily., Disp: , Rfl:  .  metFORMIN (GLUCOPHAGE-XR) 500 MG 24 hr tablet, Take 2 tablets by mouth daily., Disp: , Rfl:  .  ONE TOUCH ULTRA TEST test strip, , Disp: , Rfl:  .  OZEMPIC, 1 MG/DOSE, 2 MG/1.5ML SOPN, Inject 1 Dose as directed once a week., Disp: , Rfl:  .  rosuvastatin (CRESTOR) 5 MG tablet, Take 10 mg by mouth 3 (three) times a week., Disp: , Rfl:    Patient Care Team: Birdie Sons, MD as PCP - General (Family Medicine) Hayden Pedro, MD as Consulting Physician (Ophthalmology) Lonia Farber, MD as Consulting Physician (Endocrinology) Monna Fam, MD as Consulting Physician (Ophthalmology)    Objective:    Vitals: BP 100/68 (BP Location: Left Arm, Patient Position: Sitting, Cuff Size: Large)   Pulse 71   Temp (!) 96.6 F (35.9 C) (Temporal)   Resp 16   Ht 6\' 4"  (1.93 m)   Wt 237 lb (107.5 kg)   SpO2 99% Comment: room air  BMI 28.85 kg/m    Vitals:   02/25/19 1446  BP: 100/68  Pulse: 71  Resp: 16  Temp: (!) 96.6 F (35.9 C)  TempSrc: Temporal  SpO2: 99%  Weight: 237 lb (107.5 kg)  Height: 6\' 4"  (1.93 m)     Physical Exam   General Appearance:    Well developed, well nourished male. Alert, cooperative, in no acute distress,  appears stated age  Head:    Normocephalic, without obvious abnormality, atraumatic  Eyes:    PERRL, conjunctiva/corneas clear, EOM's intact, fundi    benign, both eyes       Ears:    Normal TM's and external ear canals, both ears  Nose:   Nares normal, septum midline, mucosa normal, no drainage   or sinus tenderness  Throat:   Lips, mucosa, and tongue normal; teeth and gums normal  Neck:   Supple, symmetrical, trachea midline, no adenopathy;       thyroid:  No enlargement/tenderness/nodules; no carotid   bruit or JVD  Back:     Symmetric, no curvature, ROM normal, no CVA tenderness  Lungs:     Clear to auscultation bilaterally, respirations unlabored  Chest wall:    No tenderness or deformity  Heart:    Normal heart rate. Normal rhythm. No murmurs, rubs, or gallops.  S1 and S2 normal  Abdomen:     Soft, non-tender, bowel sounds active all four quadrants,    no masses, no organomegaly  Genitalia:    deferred  Rectal:    deferred  Extremities:   All extremities are intact. No cyanosis or edema  Pulses:   2+ and symmetric all extremities  Skin:   Skin color, texture, turgor normal, no rashes or lesions  Lymph nodes:   Cervical, supraclavicular, and axillary nodes normal  Neurologic:   CNII-XII intact. Normal strength, sensation and reflexes      throughout    Depression Screen PHQ 2/9 Scores 02/25/2019 02/25/2019 11/24/2016 11/25/2015  PHQ - 2 Score 0 0 0 0  PHQ- 9 Score 0 - - 3       Assessment & Plan:     Routine Health Maintenance and Physical Exam  Exercise Activities and Dietary recommendations Goals   None     Immunization History  Administered Date(s) Administered  . Influenza,inj,Quad PF,6+ Mos 11/24/2016, 11/28/2017, 12/05/2018  . Influenza-Unspecified 12/27/2014  . Pneumococcal Polysaccharide-23 02/24/2011  . Tdap 02/24/2011  . Zoster Recombinat (Shingrix) 11/28/2017, 01/29/2018    Health Maintenance  Topic Date Due  . FOOT EXAM  11/15/1974  . HIV  Screening  11/15/1979  . OPHTHALMOLOGY EXAM  07/25/2015  . HEMOGLOBIN A1C  04/14/2017  . INFLUENZA VACCINE  10/27/2018  . COLONOSCOPY  02/17/2019  . TETANUS/TDAP  02/23/2021  . PNEUMOCOCCAL POLYSACCHARIDE VACCINE AGE 87-64 HIGH RISK  Completed  Discussed health benefits of physical activity, and encouraged him to engage in regular exercise appropriate for his age and condition.    -------------------------------------------------------------------- 1. Annual physical exam Doing well, encouraged healthy diet and exercise.   2. Colon cancer screening  - Cologuard  3. Prostate cancer screening  - PSA  4. Retinopathy Continue follow up ophthalmology.   5. 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 Darlyne Russian'Connell     Donald Fisher, MD  [facility]

## 2019-02-26 LAB — PSA: Prostate Specific Ag, Serum: 0.5 ng/mL (ref 0.0–4.0)

## 2019-03-15 ENCOUNTER — Other Ambulatory Visit: Payer: Self-pay

## 2019-03-15 ENCOUNTER — Encounter (INDEPENDENT_AMBULATORY_CARE_PROVIDER_SITE_OTHER): Payer: BC Managed Care – PPO | Admitting: Ophthalmology

## 2019-03-15 DIAGNOSIS — H35033 Hypertensive retinopathy, bilateral: Secondary | ICD-10-CM

## 2019-03-15 DIAGNOSIS — E11311 Type 2 diabetes mellitus with unspecified diabetic retinopathy with macular edema: Secondary | ICD-10-CM | POA: Diagnosis not present

## 2019-03-15 DIAGNOSIS — E113512 Type 2 diabetes mellitus with proliferative diabetic retinopathy with macular edema, left eye: Secondary | ICD-10-CM

## 2019-03-15 DIAGNOSIS — I10 Essential (primary) hypertension: Secondary | ICD-10-CM | POA: Diagnosis not present

## 2019-03-15 DIAGNOSIS — E113311 Type 2 diabetes mellitus with moderate nonproliferative diabetic retinopathy with macular edema, right eye: Secondary | ICD-10-CM | POA: Diagnosis not present

## 2019-04-19 ENCOUNTER — Encounter (INDEPENDENT_AMBULATORY_CARE_PROVIDER_SITE_OTHER): Payer: BC Managed Care – PPO | Admitting: Ophthalmology

## 2019-04-19 DIAGNOSIS — E10319 Type 1 diabetes mellitus with unspecified diabetic retinopathy without macular edema: Secondary | ICD-10-CM

## 2019-04-19 DIAGNOSIS — E103291 Type 1 diabetes mellitus with mild nonproliferative diabetic retinopathy without macular edema, right eye: Secondary | ICD-10-CM

## 2019-04-19 DIAGNOSIS — H35033 Hypertensive retinopathy, bilateral: Secondary | ICD-10-CM

## 2019-04-19 DIAGNOSIS — I10 Essential (primary) hypertension: Secondary | ICD-10-CM | POA: Diagnosis not present

## 2019-04-19 DIAGNOSIS — E103512 Type 1 diabetes mellitus with proliferative diabetic retinopathy with macular edema, left eye: Secondary | ICD-10-CM | POA: Diagnosis not present

## 2019-04-19 DIAGNOSIS — H43813 Vitreous degeneration, bilateral: Secondary | ICD-10-CM

## 2019-05-31 ENCOUNTER — Encounter (INDEPENDENT_AMBULATORY_CARE_PROVIDER_SITE_OTHER): Payer: BC Managed Care – PPO | Admitting: Ophthalmology

## 2019-05-31 ENCOUNTER — Other Ambulatory Visit: Payer: Self-pay

## 2019-05-31 DIAGNOSIS — E113592 Type 2 diabetes mellitus with proliferative diabetic retinopathy without macular edema, left eye: Secondary | ICD-10-CM

## 2019-05-31 DIAGNOSIS — E113291 Type 2 diabetes mellitus with mild nonproliferative diabetic retinopathy without macular edema, right eye: Secondary | ICD-10-CM

## 2019-05-31 DIAGNOSIS — I10 Essential (primary) hypertension: Secondary | ICD-10-CM | POA: Diagnosis not present

## 2019-05-31 DIAGNOSIS — E11319 Type 2 diabetes mellitus with unspecified diabetic retinopathy without macular edema: Secondary | ICD-10-CM

## 2019-05-31 DIAGNOSIS — H35033 Hypertensive retinopathy, bilateral: Secondary | ICD-10-CM

## 2019-05-31 DIAGNOSIS — H43813 Vitreous degeneration, bilateral: Secondary | ICD-10-CM

## 2019-06-14 ENCOUNTER — Telehealth: Payer: Self-pay | Admitting: Family Medicine

## 2019-06-14 DIAGNOSIS — Z1211 Encounter for screening for malignant neoplasm of colon: Secondary | ICD-10-CM

## 2019-06-14 NOTE — Telephone Encounter (Signed)
Referral ordered

## 2019-06-14 NOTE — Telephone Encounter (Signed)
Patient is calling to check on the status of a referral to be placed for [facility] for a colonoscopy To Dr. Daleen Squibb Please advise Cb- [phone]

## 2019-06-18 ENCOUNTER — Other Ambulatory Visit: Payer: Self-pay

## 2019-06-19 ENCOUNTER — Ambulatory Visit (INDEPENDENT_AMBULATORY_CARE_PROVIDER_SITE_OTHER): Payer: Self-pay | Admitting: Gastroenterology

## 2019-06-19 ENCOUNTER — Other Ambulatory Visit: Payer: Self-pay

## 2019-06-19 VITALS — Ht 76.0 in | Wt 237.0 lb

## 2019-06-19 DIAGNOSIS — Z1211 Encounter for screening for malignant neoplasm of colon: Secondary | ICD-10-CM

## 2019-06-19 NOTE — Patient Instructions (Signed)
Colonoscopy has been scheduled for Friday 07/19/19 with Dr. Servando Snare at [facility].  COVID Test Date Wed 07/17/19 between 8am and 1pm at [facility].  No appt needed.  Colonoscopy prep instructions can be found under the letters in mychart.  Thank you,  Marcelino Duster, CMA

## 2019-07-11 DIAGNOSIS — Z20828 Contact with and (suspected) exposure to other viral communicable diseases: Secondary | ICD-10-CM | POA: Diagnosis not present

## 2019-07-12 ENCOUNTER — Encounter (INDEPENDENT_AMBULATORY_CARE_PROVIDER_SITE_OTHER): Payer: BC Managed Care – PPO | Admitting: Ophthalmology

## 2019-07-12 DIAGNOSIS — I10 Essential (primary) hypertension: Secondary | ICD-10-CM | POA: Diagnosis not present

## 2019-07-12 DIAGNOSIS — E113592 Type 2 diabetes mellitus with proliferative diabetic retinopathy without macular edema, left eye: Secondary | ICD-10-CM | POA: Diagnosis not present

## 2019-07-12 DIAGNOSIS — E113211 Type 2 diabetes mellitus with mild nonproliferative diabetic retinopathy with macular edema, right eye: Secondary | ICD-10-CM

## 2019-07-12 DIAGNOSIS — H35033 Hypertensive retinopathy, bilateral: Secondary | ICD-10-CM

## 2019-07-12 DIAGNOSIS — E11319 Type 2 diabetes mellitus with unspecified diabetic retinopathy without macular edema: Secondary | ICD-10-CM | POA: Diagnosis not present

## 2019-07-12 DIAGNOSIS — H43813 Vitreous degeneration, bilateral: Secondary | ICD-10-CM

## 2019-07-17 ENCOUNTER — Other Ambulatory Visit
Admission: RE | Admit: 2019-07-17 | Discharge: 2019-07-17 | Disposition: A | Discharge status: Home / Self Care | Payer: BC Managed Care – PPO | Source: Ambulatory Visit | Attending: Gastroenterology | Admitting: Gastroenterology

## 2019-07-17 ENCOUNTER — Other Ambulatory Visit: Payer: Self-pay

## 2019-07-17 DIAGNOSIS — E1142 Type 2 diabetes mellitus with diabetic polyneuropathy: Secondary | ICD-10-CM | POA: Diagnosis not present

## 2019-07-17 DIAGNOSIS — I10 Essential (primary) hypertension: Secondary | ICD-10-CM | POA: Diagnosis not present

## 2019-07-17 DIAGNOSIS — Z794 Long term (current) use of insulin: Secondary | ICD-10-CM | POA: Diagnosis not present

## 2019-07-17 DIAGNOSIS — Z01812 Encounter for preprocedural laboratory examination: Secondary | ICD-10-CM | POA: Insufficient documentation

## 2019-07-17 DIAGNOSIS — Z20822 Contact with and (suspected) exposure to covid-19: Secondary | ICD-10-CM | POA: Insufficient documentation

## 2019-07-17 DIAGNOSIS — E1169 Type 2 diabetes mellitus with other specified complication: Secondary | ICD-10-CM | POA: Diagnosis not present

## 2019-07-17 DIAGNOSIS — E785 Hyperlipidemia, unspecified: Secondary | ICD-10-CM | POA: Diagnosis not present

## 2019-07-17 LAB — SARS CORONAVIRUS 2 (TAT 6-24 HRS): SARS Coronavirus 2: NEGATIVE

## 2019-07-18 ENCOUNTER — Encounter: Payer: Self-pay | Admitting: Gastroenterology

## 2019-07-19 ENCOUNTER — Ambulatory Visit: Payer: BC Managed Care – PPO | Admitting: Registered Nurse

## 2019-07-19 ENCOUNTER — Encounter
Admission: RE | Disposition: A | Discharge status: Home / Self Care | Payer: Self-pay | Source: Home / Self Care | Attending: Gastroenterology

## 2019-07-19 ENCOUNTER — Encounter: Payer: Self-pay | Admitting: Gastroenterology

## 2019-07-19 ENCOUNTER — Other Ambulatory Visit: Payer: Self-pay

## 2019-07-19 ENCOUNTER — Ambulatory Visit
Admission: RE | Admit: 2019-07-19 | Discharge: 2019-07-19 | Disposition: A | Discharge status: Home / Self Care | Payer: BC Managed Care – PPO | Attending: Gastroenterology | Admitting: Gastroenterology

## 2019-07-19 DIAGNOSIS — Z1211 Encounter for screening for malignant neoplasm of colon: Secondary | ICD-10-CM

## 2019-07-19 DIAGNOSIS — K640 First degree hemorrhoids: Secondary | ICD-10-CM | POA: Diagnosis not present

## 2019-07-19 DIAGNOSIS — I10 Essential (primary) hypertension: Secondary | ICD-10-CM | POA: Insufficient documentation

## 2019-07-19 DIAGNOSIS — E119 Type 2 diabetes mellitus without complications: Secondary | ICD-10-CM | POA: Diagnosis not present

## 2019-07-19 DIAGNOSIS — Z881 Allergy status to other antibiotic agents status: Secondary | ICD-10-CM | POA: Diagnosis not present

## 2019-07-19 DIAGNOSIS — Z79899 Other long term (current) drug therapy: Secondary | ICD-10-CM | POA: Insufficient documentation

## 2019-07-19 DIAGNOSIS — G709 Myoneural disorder, unspecified: Secondary | ICD-10-CM | POA: Diagnosis not present

## 2019-07-19 DIAGNOSIS — Z794 Long term (current) use of insulin: Secondary | ICD-10-CM | POA: Diagnosis not present

## 2019-07-19 DIAGNOSIS — Z833 Family history of diabetes mellitus: Secondary | ICD-10-CM | POA: Diagnosis not present

## 2019-07-19 HISTORY — PX: COLONOSCOPY WITH PROPOFOL: SHX5780

## 2019-07-19 SURGERY — COLONOSCOPY WITH PROPOFOL
Anesthesia: General

## 2019-07-19 MED ORDER — LIDOCAINE HCL (CARDIAC) PF 100 MG/5ML IV SOSY
PREFILLED_SYRINGE | INTRAVENOUS | Status: DC | PRN
  Administered 2019-07-19: 100 mg via INTRAVENOUS

## 2019-07-19 MED ORDER — PROPOFOL 10 MG/ML IV BOLUS
INTRAVENOUS | Status: DC | PRN
  Administered 2019-07-19: 100 mg via INTRAVENOUS

## 2019-07-19 MED ORDER — PROPOFOL 500 MG/50ML IV EMUL
INTRAVENOUS | Status: DC | PRN
  Administered 2019-07-19: 150 ug/kg/min via INTRAVENOUS

## 2019-07-19 MED ORDER — EPHEDRINE SULFATE 50 MG/ML IJ SOLN
INTRAMUSCULAR | Status: DC | PRN
  Administered 2019-07-19: 10 mg via INTRAVENOUS

## 2019-07-19 MED ORDER — SODIUM CHLORIDE 0.9 % IV SOLN: INTRAVENOUS | Status: DC

## 2019-07-19 NOTE — H&P (Signed)
Lucilla Lame, MD [facility] [address], [place], [place] [address] Phone: [phone] Fax : [phone]  Primary Care Physician:  Birdie Sons, MD Primary Gastroenterologist:  Dr. Allen Norris  Pre-Procedure History & Physical: HPI:  Jimmy Mills is a 54 y.o. male is here for a screening colonoscopy.   Past Medical History:  Diagnosis Date  . Diabetes mellitus without complication ([place])   . History of chicken pox 12/30/2014   DID have Chicken Pox. DID have Mumps.    . Hypertension     Past Surgical History:  Procedure Laterality Date  . BACK SURGERY    . CATARACT EXTRACTION Left 06/2014  . CERVICAL FUSION  11/10/2014  . SHOULDER ARTHROSCOPY Right     Prior to Admission medications   Medication Sig Start Date End Date Taking? Authorizing Provider  amLODipine (NORVASC) 5 MG tablet Take 5 mg by mouth daily.    [provider]  amoxicillin (AMOXIL) 500 MG capsule Take 500 mg by mouth 3 (three) times daily. 04/12/19   [provider]  Besifloxacin HCl (BESIVANCE) 0.6 % SUSP Besivance 0.6 % eye drops,suspension 05/26/17   [provider]  Blood Glucose Monitoring Suppl (FIFTY50 GLUCOSE METER 2.0) w/Device KIT Use as directed 02/04/19 02/04/20  [provider]  brimonidine (ALPHAGAN) 0.2 % ophthalmic solution Place 1 drop into the right eye 2 (two) times daily. 01/28/19   [provider]  dicyclomine (BENTYL) 20 MG tablet  04/20/10   [provider]  dorzolamide-timolol (COSOPT) 22.3-6.8 MG/ML ophthalmic solution Place 1 drop into the right eye 2 (two) times daily. 05/01/19   [provider]  glimepiride (AMARYL) 2 MG tablet Take by mouth. 10/15/18 10/15/19  [provider]  Ibuprofen-Famotidine (DUEXIS) 800-26.6 MG TABS Take 1 tablet by mouth 3 (three) times daily. Patient not taking: Reported on 06/19/2019 01/15/16   Edrick Kins, DPM  Insulin Pen Needle (INSUPEN SENSITIVE) 32G X 6 MM MISC  02/26/10   [provider]  JARDIANCE 25 MG TABS tablet Take 25 mg by mouth daily. 02/02/19   [provider]  losartan (COZAAR) 100 MG tablet Take 100 mg by mouth daily.    [provider]  metFORMIN (GLUCOPHAGE-XR) 500 MG 24 hr tablet Take 2 tablets by mouth daily. 02/22/19   [provider]  ofloxacin (OCUFLOX) 0.3 % ophthalmic solution ofloxacin 0.3 % eye drops    [provider]  ONE TOUCH ULTRA TEST test strip  10/02/14   [provider]  OZEMPIC, 1 MG/DOSE, 2 MG/1.5ML SOPN Inject 1 Dose as directed once a week. 12/27/18   [provider]  prednisoLONE acetate (PRED FORTE) 1 % ophthalmic suspension SHAKE LIQUID AND INSTILL 1 DROP IN LEFT EYE FOUR TIMES DAILY FOR 1 WEEK THEN STOP 04/19/19   [provider]  rosuvastatin (CRESTOR) 5 MG tablet Take 10 mg by mouth 3 (three) times a week.    [provider]    Allergies as of 06/20/2019 - Review Complete 06/19/2019  Allergen Reaction Noted  . Doxycycline Rash 12/30/2014    Family History  Problem Relation Age of Onset  . Diabetes Mother   . Glaucoma Mother   . Liver cancer Mother   . Pancreatic cancer Mother   . Cancer Father        cancer of the prostate  . Dementia Father     Social History   Socioeconomic History  . Marital status: Divorced    Spouse name: Not on file  .  Number of children: 2  . Years of education: Not on file  . Highest education level: Not on file  Occupational History    Employer: [employer]    Comment: works as Administrator in [place] Use  . Smoking status: Never Smoker  . Smokeless tobacco: Never Used  Substance and Sexual Activity  . Alcohol use: No  . Drug use: No  . Sexual activity: Yes  Other Topics Concern  . Not on file  Social History Narrative  . Not on file   Social Determinants of Health   Financial Resource Strain:   . Difficulty of Paying Living Expenses:   Food Insecurity:   . Worried About Charity fundraiser in  the Last Year:   . Arboriculturist in the Last Year:   Transportation Needs:   . Film/video editor (Medical):   Marland Kitchen Lack of Transportation (Non-Medical):   Physical Activity:   . Days of Exercise per Week:   . Minutes of Exercise per Session:   Stress:   . Feeling of Stress :   Social Connections:   . Frequency of Communication with Friends and Family:   . Frequency of Social Gatherings with Friends and Family:   . Attends Religious Services:   . Active Member of Clubs or Organizations:   . Attends Archivist Meetings:   Marland Kitchen Marital Status:   Intimate Partner Violence:   . Fear of Current or Ex-Partner:   . Emotionally Abused:   Marland Kitchen Physically Abused:   . Sexually Abused:     Review of Systems: See HPI, otherwise negative ROS  Physical Exam: BP 127/76   Pulse 80   Temp (!) 97.3 F (36.3 C) (Temporal)   Resp 18   Ht '6\' 4"'$  (1.93 m)   Wt 106.6 kg   SpO2 100%   BMI 28.61 kg/m  General:   Alert,  pleasant and cooperative in NAD Head:  Normocephalic and atraumatic. Neck:  Supple; no masses or thyromegaly. Lungs:  Clear throughout to auscultation.    Heart:  Regular rate and rhythm. Abdomen:  Soft, nontender and nondistended. Normal bowel sounds, without guarding, and without rebound.   Neurologic:  Alert and  oriented x4;  grossly normal neurologically.  Impression/Plan: Jimmy Mills is now here to undergo a screening colonoscopy.  Risks, benefits, and alternatives regarding colonoscopy have been reviewed with the patient.  Questions have been answered.  All parties agreeable.

## 2019-07-19 NOTE — Transfer of Care (Signed)
Immediate Anesthesia Transfer of Care Note  Patient: Jimmy Mills  Procedure(s) Performed: COLONOSCOPY WITH PROPOFOL (N/A )  Patient Location: [facility]  Anesthesia Type:General  Level of Consciousness: patient cooperative, drowsy  Airway & Oxygen Therapy: Patient Spontanous Breathing  Post-op Assessment: Report given to RN and Post -op Vital signs reviewed and stable  Post vital signs: Reviewed and stable  Last Vitals:  Vitals Value Taken Time  BP 102/68 07/19/19 0920  Temp 36.6 C 07/19/19 0920  Pulse 81 07/19/19 0921  Resp 13 07/19/19 0921  SpO2 100 % 07/19/19 0921  Vitals shown include unvalidated device data.  Last Pain:  Vitals:   07/19/19 0920  TempSrc: Temporal  PainSc: Asleep         Complications: No apparent anesthesia complications

## 2019-07-19 NOTE — Anesthesia Preprocedure Evaluation (Signed)
Anesthesia Evaluation  Patient identified by MRN, date of birth, ID band Patient awake    Reviewed: Allergy & Precautions, H&P , NPO status , Patient's Chart, lab work & pertinent test results, reviewed documented beta blocker date and time   Airway Mallampati: II   Neck ROM: full    Dental  (+) Poor Dentition   Pulmonary neg pulmonary ROS,    Pulmonary exam normal        Cardiovascular Exercise Tolerance: Good hypertension, On Medications negative cardio ROS Normal cardiovascular exam Rhythm:regular Rate:Normal     Neuro/Psych  Neuromuscular disease negative psych ROS   GI/Hepatic negative GI ROS, Neg liver ROS,   Endo/Other  negative endocrine ROSdiabetes, Well Controlled, Type 2, Oral Hypoglycemic Agents  Renal/GU negative Renal ROS  negative genitourinary   Musculoskeletal   Abdominal   Peds  Hematology negative hematology ROS (+)   Anesthesia Other Findings Past Medical History: No date: Diabetes mellitus without complication (HCC) 12/30/2014: History of chicken pox     Comment:  DID have Chicken Pox. DID have Mumps.   No date: Hypertension Past Surgical History: No date: BACK SURGERY 06/2014: CATARACT EXTRACTION; Left 11/10/2014: CERVICAL FUSION No date: SHOULDER ARTHROSCOPY; Right   Reproductive/Obstetrics negative OB ROS                             Anesthesia Physical Anesthesia Plan  ASA: II  Anesthesia Plan: General   Post-op Pain Management:    Induction:   PONV Risk Score and Plan:   Airway Management Planned:   Additional Equipment:   Intra-op Plan:   Post-operative Plan:   Informed Consent: I have reviewed the patients History and Physical, chart, labs and discussed the procedure including the risks, benefits and alternatives for the proposed anesthesia with the patient or authorized representative who has indicated his/her understanding and acceptance.      Dental Advisory Given  Plan Discussed with: CRNA  Anesthesia Plan Comments:         Anesthesia Quick Evaluation

## 2019-07-19 NOTE — Op Note (Signed)
[facility]astroenterology Patient Name: Jimmy Mills Procedure Date: 07/19/2019 8:56 AM MRN: [id number] Account #: [id number] Date of Birth: March 25, 1966 Admit Type: Outpatient Age: 54 [facility] ROOM 4 Gender: Male Note Status: Finalized Procedure:             Colonoscopy Indications:           Screening for colorectal malignant neoplasm Providers:             Midge Minium MD, MD Referring MD:          Demetrios Isaacs. Sherrie Mustache, MD (Referring MD) Medicines:             Propofol per Anesthesia Complications:         No immediate complications. Procedure:             Pre-Anesthesia Assessment:                        - Prior to the procedure, a History and Physical was                         performed, and patient medications and allergies were                         reviewed. The patient's tolerance of previous                         anesthesia was also reviewed. The risks and benefits                         of the procedure and the sedation options and risks                         were discussed with the patient. All questions were                         answered, and informed consent was obtained. Prior                         Anticoagulants: The patient has taken no previous                         anticoagulant or antiplatelet agents. ASA Grade                         Assessment: II - A patient with mild systemic disease.                         After reviewing the risks and benefits, the patient                         was deemed in satisfactory condition to undergo the                         procedure.                        After obtaining informed consent, the colonoscope was  passed under direct vision. Throughout the procedure,                         the patient's blood pressure, pulse, and oxygen                         saturations were monitored continuously. The                         Colonoscope was introduced through  the anus and                         advanced to the the cecum, identified by appendiceal                         orifice and ileocecal valve. The colonoscopy was                         performed without difficulty. The patient tolerated                         the procedure well. The quality of the bowel                         preparation was excellent. Findings:      The perianal and digital rectal examinations were normal.      Non-bleeding internal hemorrhoids were found during retroflexion. The       hemorrhoids were Grade I (internal hemorrhoids that do not prolapse). Impression:            - Non-bleeding internal hemorrhoids.                        - No specimens collected. Recommendation:        - Discharge patient to home.                        - Resume previous diet.                        - Continue present medications.                        - Repeat colonoscopy in 10 years for screening unless                         any change in family history or lower GI problems. Procedure Code(s):     --- Professional ---                        [phone], Colonoscopy, flexible; diagnostic, including                         collection of specimen(s) by brushing or washing, when                         performed (separate procedure) Diagnosis Code(s):     --- Professional ---                        Z12.11, Encounter for  screening for malignant neoplasm                         of colon CPT copyright 2019 American Medical Association. All rights reserved. The codes documented in this report are preliminary and upon coder review may  be revised to meet current compliance requirements. Midge Minium MD, MD 07/19/2019 9:15:21 AM This report has been signed electronically. Number of Addenda: 0 Note Initiated On: 07/19/2019 8:56 AM Scope Withdrawal Time: 0 hours 8 minutes 58 seconds  Total Procedure Duration: 0 hours 12 minutes 9 seconds  Estimated Blood Loss:  Estimated blood loss: none.       [facility]

## 2019-07-23 NOTE — Anesthesia Postprocedure Evaluation (Signed)
Anesthesia Post Note  Patient: Jimmy Mills  Procedure(s) Performed: COLONOSCOPY WITH PROPOFOL (N/A )  Patient location during evaluation: PACU Anesthesia Type: General Level of consciousness: awake and alert Pain management: pain level controlled Vital Signs Assessment: post-procedure vital signs reviewed and stable Respiratory status: spontaneous breathing, nonlabored ventilation, respiratory function stable and patient connected to nasal cannula oxygen Cardiovascular status: blood pressure returned to baseline and stable Postop Assessment: no apparent nausea or vomiting Anesthetic complications: no     Last Vitals:  Vitals:   07/19/19 0839 07/19/19 0920  BP: 127/76 102/68  Pulse: 80 77  Resp: 18 12  Temp: (!) 36.3 C 36.6 C  SpO2: 100% 100%    Last Pain:  Vitals:   07/21/19 0924  TempSrc:   PainSc: 0-No pain                 Yevette Edwards

## 2019-07-30 DIAGNOSIS — E1142 Type 2 diabetes mellitus with diabetic polyneuropathy: Secondary | ICD-10-CM | POA: Diagnosis not present

## 2019-07-30 DIAGNOSIS — Z794 Long term (current) use of insulin: Secondary | ICD-10-CM | POA: Diagnosis not present

## 2019-07-30 DIAGNOSIS — E1159 Type 2 diabetes mellitus with other circulatory complications: Secondary | ICD-10-CM | POA: Diagnosis not present

## 2019-07-30 DIAGNOSIS — E1169 Type 2 diabetes mellitus with other specified complication: Secondary | ICD-10-CM | POA: Diagnosis not present

## 2019-08-16 ENCOUNTER — Encounter (INDEPENDENT_AMBULATORY_CARE_PROVIDER_SITE_OTHER): Payer: BC Managed Care – PPO | Admitting: Ophthalmology

## 2019-08-16 ENCOUNTER — Other Ambulatory Visit: Payer: Self-pay

## 2019-08-16 DIAGNOSIS — H35033 Hypertensive retinopathy, bilateral: Secondary | ICD-10-CM

## 2019-08-16 DIAGNOSIS — E11319 Type 2 diabetes mellitus with unspecified diabetic retinopathy without macular edema: Secondary | ICD-10-CM | POA: Diagnosis not present

## 2019-08-16 DIAGNOSIS — E113391 Type 2 diabetes mellitus with moderate nonproliferative diabetic retinopathy without macular edema, right eye: Secondary | ICD-10-CM | POA: Diagnosis not present

## 2019-08-16 DIAGNOSIS — I10 Essential (primary) hypertension: Secondary | ICD-10-CM

## 2019-08-16 DIAGNOSIS — E113592 Type 2 diabetes mellitus with proliferative diabetic retinopathy without macular edema, left eye: Secondary | ICD-10-CM

## 2019-08-16 DIAGNOSIS — H43813 Vitreous degeneration, bilateral: Secondary | ICD-10-CM

## 2019-09-27 ENCOUNTER — Other Ambulatory Visit: Payer: Self-pay

## 2019-09-27 ENCOUNTER — Encounter (INDEPENDENT_AMBULATORY_CARE_PROVIDER_SITE_OTHER): Payer: BC Managed Care – PPO | Admitting: Ophthalmology

## 2019-09-27 DIAGNOSIS — H35033 Hypertensive retinopathy, bilateral: Secondary | ICD-10-CM

## 2019-09-27 DIAGNOSIS — E113391 Type 2 diabetes mellitus with moderate nonproliferative diabetic retinopathy without macular edema, right eye: Secondary | ICD-10-CM

## 2019-09-27 DIAGNOSIS — H43813 Vitreous degeneration, bilateral: Secondary | ICD-10-CM

## 2019-09-27 DIAGNOSIS — E113592 Type 2 diabetes mellitus with proliferative diabetic retinopathy without macular edema, left eye: Secondary | ICD-10-CM | POA: Diagnosis not present

## 2019-09-27 DIAGNOSIS — E11319 Type 2 diabetes mellitus with unspecified diabetic retinopathy without macular edema: Secondary | ICD-10-CM

## 2019-09-27 DIAGNOSIS — I10 Essential (primary) hypertension: Secondary | ICD-10-CM | POA: Diagnosis not present

## 2019-11-06 DIAGNOSIS — Z20828 Contact with and (suspected) exposure to other viral communicable diseases: Secondary | ICD-10-CM | POA: Diagnosis not present

## 2019-11-08 ENCOUNTER — Encounter (INDEPENDENT_AMBULATORY_CARE_PROVIDER_SITE_OTHER): Payer: BC Managed Care – PPO | Admitting: Ophthalmology

## 2019-11-08 ENCOUNTER — Other Ambulatory Visit: Payer: Self-pay

## 2019-11-08 DIAGNOSIS — E103391 Type 1 diabetes mellitus with moderate nonproliferative diabetic retinopathy without macular edema, right eye: Secondary | ICD-10-CM

## 2019-11-08 DIAGNOSIS — H43813 Vitreous degeneration, bilateral: Secondary | ICD-10-CM

## 2019-11-08 DIAGNOSIS — I10 Essential (primary) hypertension: Secondary | ICD-10-CM

## 2019-11-08 DIAGNOSIS — E10319 Type 1 diabetes mellitus with unspecified diabetic retinopathy without macular edema: Secondary | ICD-10-CM | POA: Diagnosis not present

## 2019-11-08 DIAGNOSIS — H35033 Hypertensive retinopathy, bilateral: Secondary | ICD-10-CM

## 2019-11-08 DIAGNOSIS — E103592 Type 1 diabetes mellitus with proliferative diabetic retinopathy without macular edema, left eye: Secondary | ICD-10-CM | POA: Diagnosis not present

## 2019-12-25 DIAGNOSIS — Z20828 Contact with and (suspected) exposure to other viral communicable diseases: Secondary | ICD-10-CM | POA: Diagnosis not present

## 2019-12-27 ENCOUNTER — Encounter (INDEPENDENT_AMBULATORY_CARE_PROVIDER_SITE_OTHER): Payer: BC Managed Care – PPO | Admitting: Ophthalmology

## 2019-12-27 ENCOUNTER — Other Ambulatory Visit: Payer: Self-pay

## 2019-12-27 DIAGNOSIS — I10 Essential (primary) hypertension: Secondary | ICD-10-CM

## 2019-12-27 DIAGNOSIS — H35033 Hypertensive retinopathy, bilateral: Secondary | ICD-10-CM

## 2019-12-27 DIAGNOSIS — E103391 Type 1 diabetes mellitus with moderate nonproliferative diabetic retinopathy without macular edema, right eye: Secondary | ICD-10-CM

## 2019-12-27 DIAGNOSIS — E10319 Type 1 diabetes mellitus with unspecified diabetic retinopathy without macular edema: Secondary | ICD-10-CM | POA: Diagnosis not present

## 2019-12-27 DIAGNOSIS — E103592 Type 1 diabetes mellitus with proliferative diabetic retinopathy without macular edema, left eye: Secondary | ICD-10-CM | POA: Diagnosis not present

## 2019-12-27 DIAGNOSIS — H43813 Vitreous degeneration, bilateral: Secondary | ICD-10-CM

## 2020-01-01 DIAGNOSIS — Z20828 Contact with and (suspected) exposure to other viral communicable diseases: Secondary | ICD-10-CM | POA: Diagnosis not present

## 2020-01-23 DIAGNOSIS — Z794 Long term (current) use of insulin: Secondary | ICD-10-CM | POA: Diagnosis not present

## 2020-01-23 DIAGNOSIS — E1142 Type 2 diabetes mellitus with diabetic polyneuropathy: Secondary | ICD-10-CM | POA: Diagnosis not present

## 2020-01-29 DIAGNOSIS — E1159 Type 2 diabetes mellitus with other circulatory complications: Secondary | ICD-10-CM | POA: Diagnosis not present

## 2020-01-29 DIAGNOSIS — Z794 Long term (current) use of insulin: Secondary | ICD-10-CM | POA: Diagnosis not present

## 2020-01-29 DIAGNOSIS — E1142 Type 2 diabetes mellitus with diabetic polyneuropathy: Secondary | ICD-10-CM | POA: Diagnosis not present

## 2020-01-29 DIAGNOSIS — E1169 Type 2 diabetes mellitus with other specified complication: Secondary | ICD-10-CM | POA: Diagnosis not present

## 2020-02-19 ENCOUNTER — Observation Stay
Admission: EM | Admit: 2020-02-19 | Discharge: 2020-02-20 | Disposition: A | Discharge status: Home / Self Care | Payer: BC Managed Care – PPO | Attending: Surgery | Admitting: Surgery

## 2020-02-19 ENCOUNTER — Observation Stay: Payer: BC Managed Care – PPO | Admitting: Certified Registered"

## 2020-02-19 ENCOUNTER — Emergency Department: Payer: BC Managed Care – PPO

## 2020-02-19 ENCOUNTER — Encounter
Admission: EM | Disposition: A | Discharge status: Home / Self Care | Payer: Self-pay | Source: Home / Self Care | Attending: Student in an Organized Health Care Education/Training Program

## 2020-02-19 ENCOUNTER — Other Ambulatory Visit: Payer: Self-pay

## 2020-02-19 DIAGNOSIS — I10 Essential (primary) hypertension: Secondary | ICD-10-CM | POA: Insufficient documentation

## 2020-02-19 DIAGNOSIS — K37 Unspecified appendicitis: Secondary | ICD-10-CM | POA: Diagnosis not present

## 2020-02-19 DIAGNOSIS — K3589 Other acute appendicitis without perforation or gangrene: Secondary | ICD-10-CM | POA: Diagnosis not present

## 2020-02-19 DIAGNOSIS — K3580 Unspecified acute appendicitis: Secondary | ICD-10-CM | POA: Diagnosis not present

## 2020-02-19 DIAGNOSIS — Z79899 Other long term (current) drug therapy: Secondary | ICD-10-CM | POA: Diagnosis not present

## 2020-02-19 DIAGNOSIS — E11319 Type 2 diabetes mellitus with unspecified diabetic retinopathy without macular edema: Secondary | ICD-10-CM | POA: Diagnosis not present

## 2020-02-19 DIAGNOSIS — Z20822 Contact with and (suspected) exposure to covid-19: Secondary | ICD-10-CM | POA: Insufficient documentation

## 2020-02-19 DIAGNOSIS — Z7984 Long term (current) use of oral hypoglycemic drugs: Secondary | ICD-10-CM | POA: Diagnosis not present

## 2020-02-19 DIAGNOSIS — R1084 Generalized abdominal pain: Secondary | ICD-10-CM | POA: Diagnosis not present

## 2020-02-19 DIAGNOSIS — K381 Appendicular concretions: Secondary | ICD-10-CM | POA: Diagnosis not present

## 2020-02-19 DIAGNOSIS — K353 Acute appendicitis with localized peritonitis, without perforation or gangrene: Secondary | ICD-10-CM

## 2020-02-19 DIAGNOSIS — E119 Type 2 diabetes mellitus without complications: Secondary | ICD-10-CM | POA: Diagnosis not present

## 2020-02-19 DIAGNOSIS — E7800 Pure hypercholesterolemia, unspecified: Secondary | ICD-10-CM | POA: Diagnosis not present

## 2020-02-19 DIAGNOSIS — R109 Unspecified abdominal pain: Secondary | ICD-10-CM | POA: Diagnosis not present

## 2020-02-19 HISTORY — PX: XI ROBOTIC LAPAROSCOPIC ASSISTED APPENDECTOMY: SHX6877

## 2020-02-19 LAB — CBC
HCT: 41.3 % (ref 39.0–52.0)
Hemoglobin: 13.9 g/dL (ref 13.0–17.0)
MCH: 29.3 pg (ref 26.0–34.0)
MCHC: 33.7 g/dL (ref 30.0–36.0)
MCV: 87.1 fL (ref 80.0–100.0)
Platelets: 230 10*3/uL (ref 150–400)
RBC: 4.74 MIL/uL (ref 4.22–5.81)
RDW: 13.2 % (ref 11.5–15.5)
WBC: 7.1 10*3/uL (ref 4.0–10.5)
nRBC: 0 % (ref 0.0–0.2)

## 2020-02-19 LAB — URINALYSIS, COMPLETE (UACMP) WITH MICROSCOPIC
Bacteria, UA: NONE SEEN
Bilirubin Urine: NEGATIVE
Glucose, UA: >=500 mg/dL — AB
Hgb urine dipstick: NEGATIVE
Ketones, ur: 80 mg/dL — AB
Leukocytes,Ua: NEGATIVE
Nitrite: NEGATIVE
Protein, ur: NEGATIVE mg/dL
Specific Gravity, Urine: 1.031 — ABNORMAL HIGH (ref 1.005–1.030)
Squamous Epithelial / HPF: NONE SEEN (ref 0–5)
pH: 6 (ref 5.0–8.0)

## 2020-02-19 LAB — COMPREHENSIVE METABOLIC PANEL
ALT: 62 U/L — ABNORMAL HIGH (ref 0–44)
AST: 61 U/L — ABNORMAL HIGH (ref 15–41)
Albumin: 4.7 g/dL (ref 3.5–5.0)
Alkaline Phosphatase: 50 U/L (ref 38–126)
Anion gap: 13 (ref 5–15)
BUN: 18 mg/dL (ref 6–20)
CO2: 24 mmol/L (ref 22–32)
Calcium: 9.4 mg/dL (ref 8.9–10.3)
Chloride: 101 mmol/L (ref 98–111)
Creatinine, Ser: 0.92 mg/dL (ref 0.61–1.24)
GFR, Estimated: >60 mL/min (ref >60)
Glucose, Bld: 159 mg/dL — ABNORMAL HIGH (ref 70–99)
Potassium: 3.8 mmol/L (ref 3.5–5.1)
Sodium: 138 mmol/L (ref 135–145)
Total Bilirubin: 2.1 mg/dL — ABNORMAL HIGH (ref 0.3–1.2)
Total Protein: 7.8 g/dL (ref 6.5–8.1)

## 2020-02-19 LAB — RESP PANEL BY RT-PCR (FLU A&B, COVID) ARPGX2
Influenza A by PCR: NEGATIVE
Influenza B by PCR: NEGATIVE
SARS Coronavirus 2 by RT PCR: NEGATIVE

## 2020-02-19 LAB — GLUCOSE, CAPILLARY: Glucose-Capillary: 122 mg/dL — ABNORMAL HIGH (ref 70–99)

## 2020-02-19 LAB — LIPASE, BLOOD: Lipase: 33 U/L (ref 11–51)

## 2020-02-19 IMAGING — CT CT ABD-PELV W/ CM
2 of 5 series · 16 of 46 positions shown, 18 images · IV contrast (APPLIED)
Comparison: None.

CLINICAL DATA: Abdominal pain.

EXAM:
CT ABDOMEN AND PELVIS WITH CONTRAST
TECHNIQUE: Multidetector CT imaging of the abdomen and pelvis was performed
using the standard protocol following bolus administration of
intravenous contrast.
CONTRAST:  100mL OMNIPAQUE IOHEXOL 300 MG/ML  SOLN

[Series 2: routine abd/pel with · axial · 0.80mm/px · z∈[-1131,-666]mm · 13 of 105 slices shown, 15 images]
[im 6/105  soft-tissue]
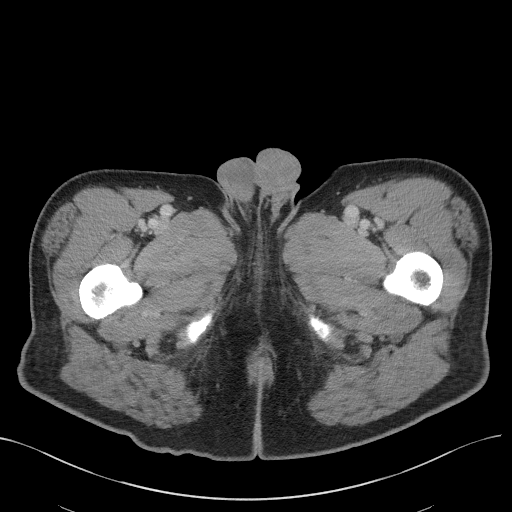
[im 6/105  bone]
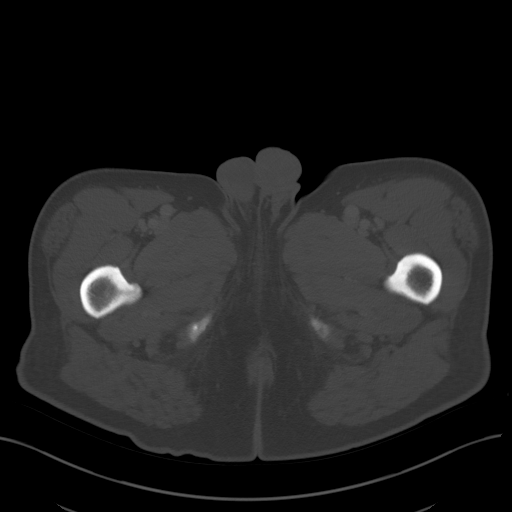
[im 17/105  soft-tissue]
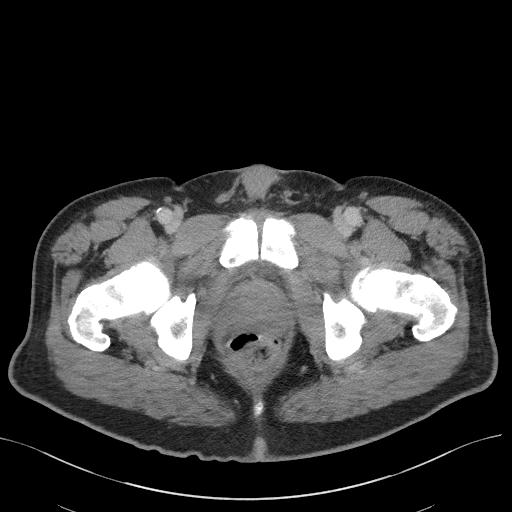
[im 22/105  soft-tissue]
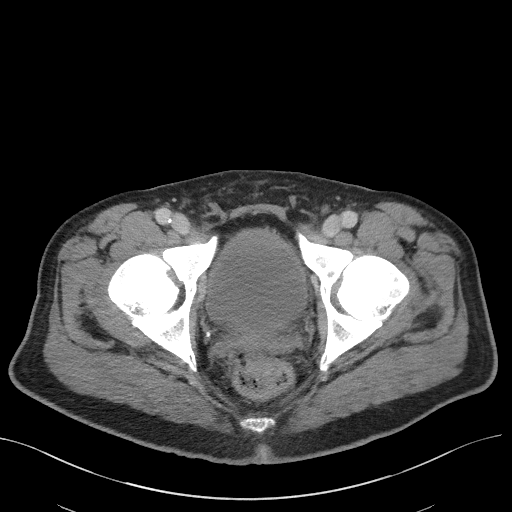
[im 28/105  soft-tissue]
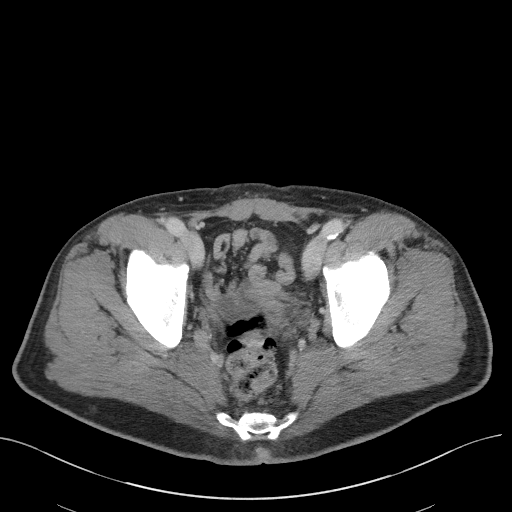
[im 39/105  soft-tissue]
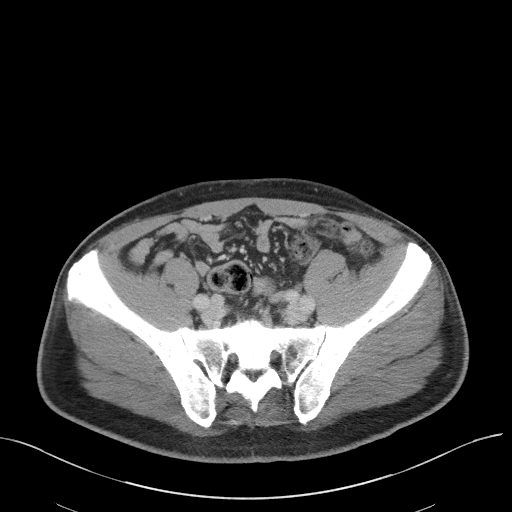
[im 44/105  soft-tissue]
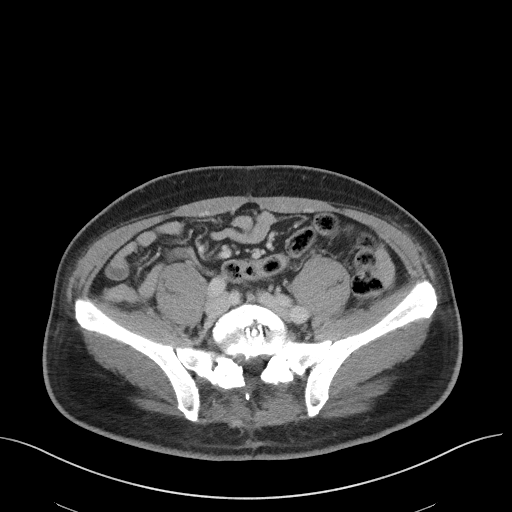
[im 55/105  soft-tissue]
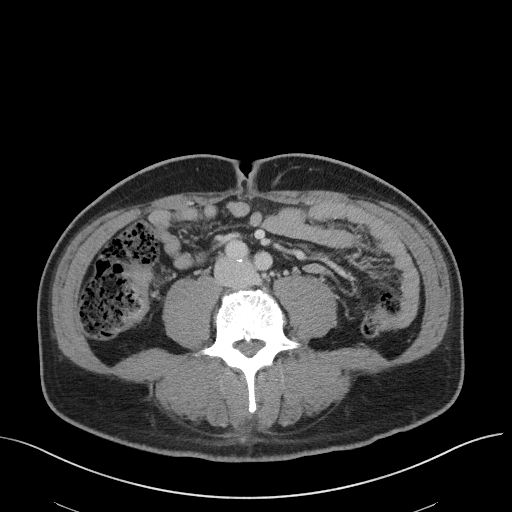
[im 61/105  soft-tissue]
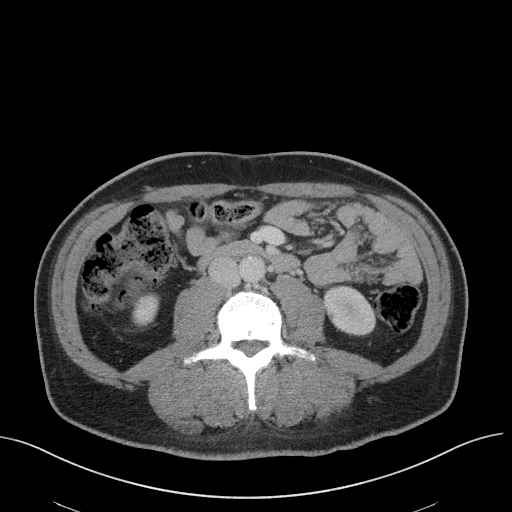
[im 66/105  soft-tissue]
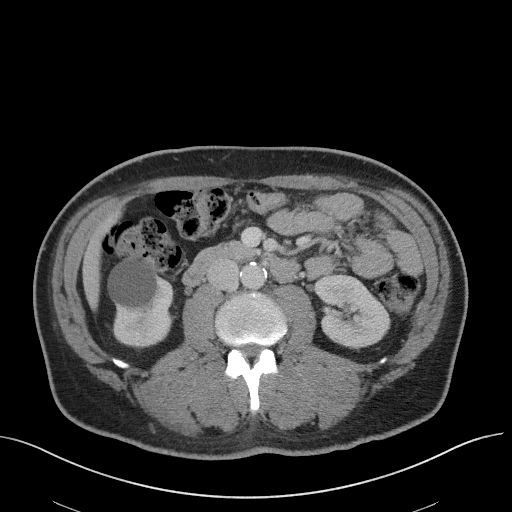
[im 66/105  bone]
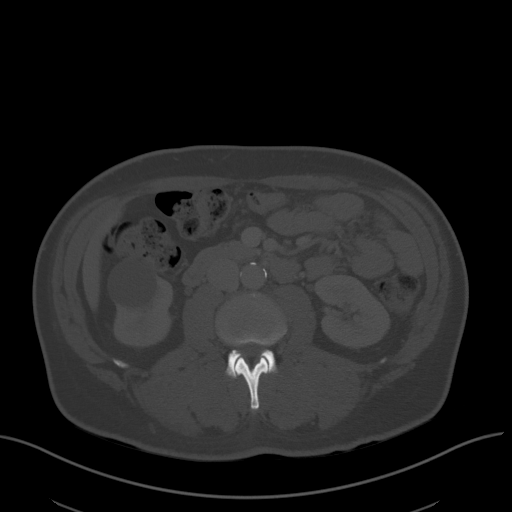
[im 77/105  soft-tissue]
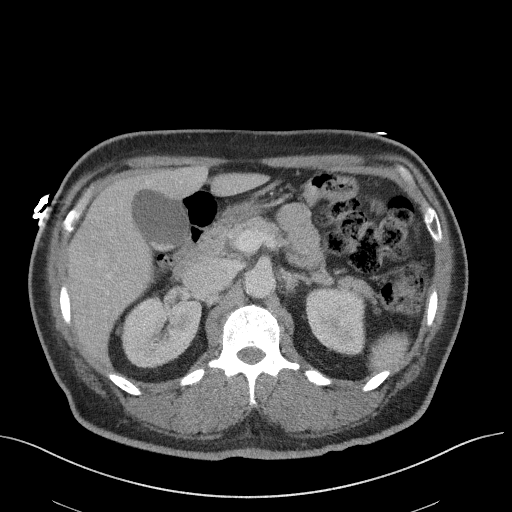
[im 83/105  soft-tissue]
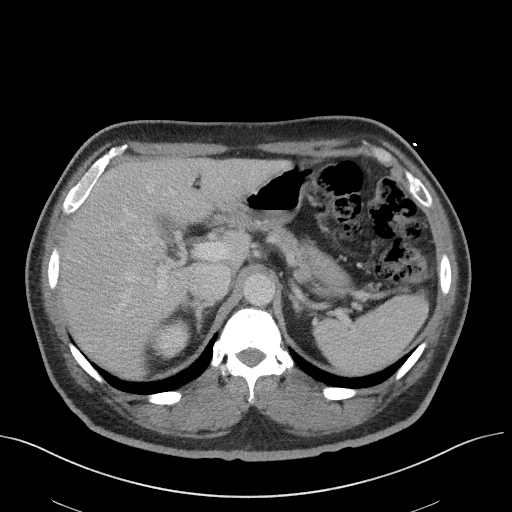
[im 88/105  soft-tissue]
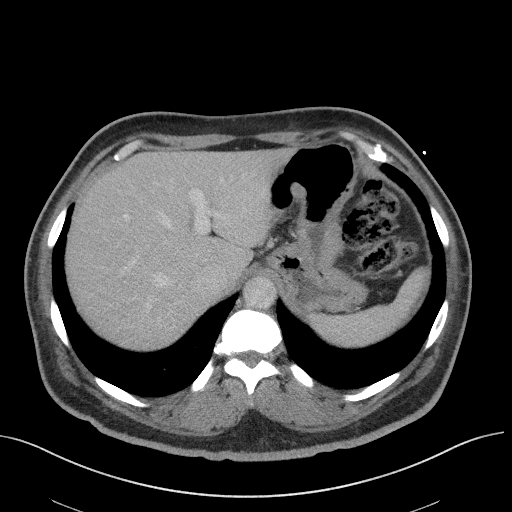
[im 99/105  soft-tissue]
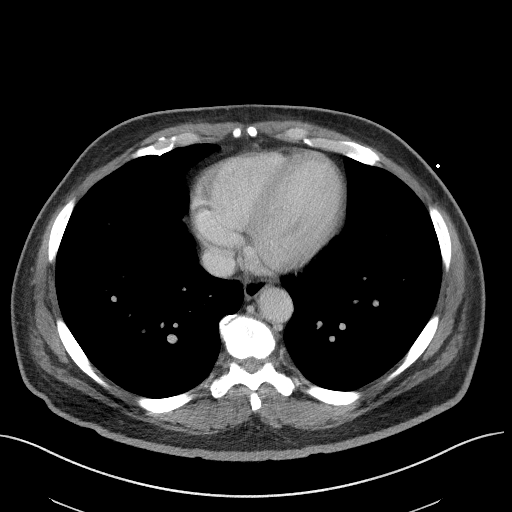

[Series 5: coronal st · coronal · 0.79mm/px · 3 of 93 slices shown]
[im 31/93  soft-tissue]
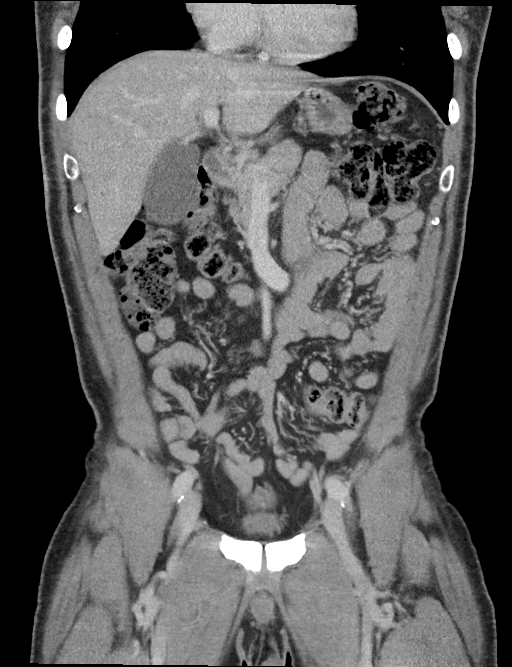
[im 41/93  soft-tissue]
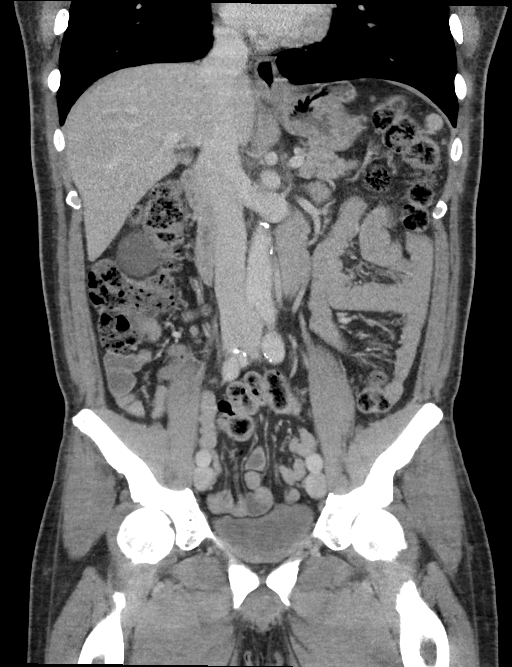
[im 52/93  soft-tissue]
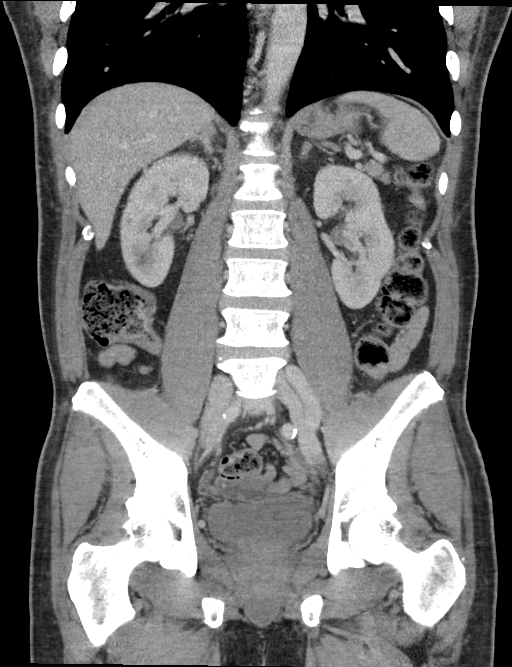

[16 of 46 positions shown; findings below may reference images not displayed]

FINDINGS: Lower chest: The lung bases are clear. The heart size is normal.

Hepatobiliary: The liver is normal. Cholelithiasis without acute
inflammation.There is no biliary ductal dilation.

Pancreas: Normal contours without ductal dilatation. No
peripancreatic fluid collection.

Spleen: Unremarkable.

Adrenals/Urinary Tract:

--Adrenal glands: Unremarkable.

--Right kidney/ureter: There is a right-sided renal cyst measuring
approximately 4 cm.

--Left kidney/ureter: No hydronephrosis or radiopaque kidney stones.

--Urinary bladder: Unremarkable.

Stomach/Bowel:

--Stomach/Duodenum: No hiatal hernia or other gastric abnormality.
Normal duodenal course and caliber.

--Small bowel: Unremarkable.

--Colon: Unremarkable.

--Appendix: There is mild fat stranding about the mid to distal
appendix. There is a stone in the midportion of the pannus. The
appendix measures up to approximately 7-8 mm in the mid section
(coronal series 5, image 40 and axial series 2, image 60). There are
few pockets of gas within the appendiceal lumen.

Vascular/Lymphatic: Atherosclerotic calcification is present within
the non-aneurysmal abdominal aorta, without hemodynamically
significant stenosis.

--No retroperitoneal lymphadenopathy.

--No mesenteric lymphadenopathy.

--No pelvic or inguinal lymphadenopathy.

Reproductive: The prostate gland is enlarged.

Other: No ascites or free air. The abdominal wall is normal.

Musculoskeletal. No acute displaced fractures.
IMPRESSION: 1. Findings are suspicious for early acute uncomplicated
appendicitis. Correlation with laboratory studies and physical exam
is recommended.
2. Cholelithiasis without acute inflammation.

Aortic Atherosclerosis ([M8]-[M8]).

## 2020-02-19 SURGERY — APPENDECTOMY, ROBOT-ASSISTED, LAPAROSCOPIC
Anesthesia: General | Site: Abdomen

## 2020-02-19 MED ORDER — LIDOCAINE HCL (PF) 2 % IJ SOLN
INTRAMUSCULAR | Status: AC
  Filled 2020-02-19: qty 5

## 2020-02-19 MED ORDER — PROPOFOL 10 MG/ML IV BOLUS
INTRAVENOUS | Status: AC
  Filled 2020-02-19: qty 20

## 2020-02-19 MED ORDER — ONDANSETRON HCL 4 MG/2ML IJ SOLN
INTRAMUSCULAR | Status: AC
  Filled 2020-02-19: qty 2

## 2020-02-19 MED ORDER — MIDAZOLAM HCL 2 MG/2ML IJ SOLN
INTRAMUSCULAR | Status: AC
  Filled 2020-02-19: qty 2

## 2020-02-19 MED ORDER — SODIUM CHLORIDE 0.9 % IV SOLN
INTRAVENOUS | Status: DC | PRN
  Administered 2020-02-19: 50 ug/min via INTRAVENOUS

## 2020-02-19 MED ORDER — MORPHINE SULFATE (PF) 4 MG/ML IV SOLN
4.0000 mg | INTRAVENOUS | Status: DC | PRN
  Administered 2020-02-19 – 2020-02-20 (×2): 4 mg via INTRAVENOUS
  Filled 2020-02-19 (×2): qty 1

## 2020-02-19 MED ORDER — OXYCODONE HCL 5 MG/5ML PO SOLN: 5.0000 mg | Freq: Once | ORAL | Status: DC | PRN

## 2020-02-19 MED ORDER — LIDOCAINE-EPINEPHRINE (PF) 1 %-1:200000 IJ SOLN
INTRAMUSCULAR | Status: DC | PRN
  Administered 2020-02-19: 9.5 mL via INTRADERMAL

## 2020-02-19 MED ORDER — SODIUM CHLORIDE 0.9 % IV SOLN: Freq: Once | INTRAVENOUS | Status: AC

## 2020-02-19 MED ORDER — LACTATED RINGERS IV SOLN: INTRAVENOUS | Status: DC | PRN

## 2020-02-19 MED ORDER — METRONIDAZOLE 500 MG PO TABS
500.0000 mg | ORAL_TABLET | Freq: Three times a day (TID) | ORAL | Status: DC
  Administered 2020-02-19 – 2020-02-20 (×2): 500 mg via ORAL
  Filled 2020-02-19 (×5): qty 1

## 2020-02-19 MED ORDER — BUPIVACAINE HCL (PF) 0.5 % IJ SOLN
INTRAMUSCULAR | Status: AC
  Filled 2020-02-19: qty 30

## 2020-02-19 MED ORDER — ONDANSETRON HCL 4 MG/2ML IJ SOLN
INTRAMUSCULAR | Status: DC | PRN
  Administered 2020-02-19: 4 mg via INTRAVENOUS

## 2020-02-19 MED ORDER — SUCCINYLCHOLINE CHLORIDE 200 MG/10ML IV SOSY
PREFILLED_SYRINGE | INTRAVENOUS | Status: AC
  Filled 2020-02-19: qty 10

## 2020-02-19 MED ORDER — MIDAZOLAM HCL 2 MG/2ML IJ SOLN
INTRAMUSCULAR | Status: DC | PRN
  Administered 2020-02-19: 2 mg via INTRAVENOUS

## 2020-02-19 MED ORDER — PHENYLEPHRINE HCL (PRESSORS) 10 MG/ML IV SOLN
INTRAVENOUS | Status: DC | PRN
  Administered 2020-02-19 (×2): 100 ug via INTRAVENOUS
  Administered 2020-02-19 (×2): 200 ug via INTRAVENOUS

## 2020-02-19 MED ORDER — PROMETHAZINE HCL 25 MG/ML IJ SOLN: 6.2500 mg | INTRAMUSCULAR | Status: DC | PRN

## 2020-02-19 MED ORDER — LIDOCAINE-EPINEPHRINE (PF) 1 %-1:200000 IJ SOLN
INTRAMUSCULAR | Status: AC
  Filled 2020-02-19: qty 30

## 2020-02-19 MED ORDER — ROCURONIUM BROMIDE 10 MG/ML (PF) SYRINGE
PREFILLED_SYRINGE | INTRAVENOUS | Status: AC
  Filled 2020-02-19: qty 10

## 2020-02-19 MED ORDER — FENTANYL CITRATE (PF) 100 MCG/2ML IJ SOLN: 25.0000 ug | INTRAMUSCULAR | Status: DC | PRN

## 2020-02-19 MED ORDER — ONDANSETRON HCL 4 MG/2ML IJ SOLN
4.0000 mg | Freq: Once | INTRAMUSCULAR | Status: AC
  Administered 2020-02-19: 4 mg via INTRAVENOUS
  Filled 2020-02-19: qty 2

## 2020-02-19 MED ORDER — FENTANYL CITRATE (PF) 100 MCG/2ML IJ SOLN
INTRAMUSCULAR | Status: DC | PRN
  Administered 2020-02-19: 50 ug via INTRAVENOUS

## 2020-02-19 MED ORDER — IOHEXOL 300 MG/ML  SOLN
100.0000 mL | Freq: Once | INTRAMUSCULAR | Status: AC | PRN
  Administered 2020-02-19: 100 mL via INTRAVENOUS

## 2020-02-19 MED ORDER — ROCURONIUM BROMIDE 100 MG/10ML IV SOLN
INTRAVENOUS | Status: DC | PRN
  Administered 2020-02-19: 50 mg via INTRAVENOUS

## 2020-02-19 MED ORDER — OXYCODONE HCL 5 MG PO TABS: 5.0000 mg | ORAL_TABLET | Freq: Once | ORAL | Status: DC | PRN

## 2020-02-19 MED ORDER — DEXAMETHASONE SODIUM PHOSPHATE 10 MG/ML IJ SOLN
INTRAMUSCULAR | Status: AC
  Filled 2020-02-19: qty 1

## 2020-02-19 MED ORDER — SODIUM CHLORIDE 0.9 % IV BOLUS
1000.0000 mL | Freq: Once | INTRAVENOUS | Status: AC
  Administered 2020-02-19: 1000 mL via INTRAVENOUS

## 2020-02-19 MED ORDER — BUPIVACAINE HCL (PF) 0.5 % IJ SOLN
INTRAMUSCULAR | Status: DC | PRN
  Administered 2020-02-19: 9.5 mL

## 2020-02-19 MED ORDER — SUGAMMADEX SODIUM 200 MG/2ML IV SOLN
INTRAVENOUS | Status: DC | PRN
  Administered 2020-02-19: 200 mg via INTRAVENOUS

## 2020-02-19 MED ORDER — PROPOFOL 10 MG/ML IV BOLUS
INTRAVENOUS | Status: DC | PRN
  Administered 2020-02-19: 170 mg via INTRAVENOUS

## 2020-02-19 MED ORDER — ACETAMINOPHEN 10 MG/ML IV SOLN
INTRAVENOUS | Status: DC | PRN
  Administered 2020-02-19: 1000 mg via INTRAVENOUS

## 2020-02-19 MED ORDER — DEXAMETHASONE SODIUM PHOSPHATE 10 MG/ML IJ SOLN
INTRAMUSCULAR | Status: DC | PRN
  Administered 2020-02-19: 8 mg via INTRAVENOUS

## 2020-02-19 MED ORDER — PROMETHAZINE HCL 25 MG/ML IJ SOLN
12.5000 mg | Freq: Four times a day (QID) | INTRAMUSCULAR | Status: DC | PRN
  Administered 2020-02-19: 12.5 mg via INTRAVENOUS
  Filled 2020-02-19: qty 1

## 2020-02-19 MED ORDER — ACETAMINOPHEN 10 MG/ML IV SOLN
INTRAVENOUS | Status: AC
  Filled 2020-02-19: qty 100

## 2020-02-19 MED ORDER — SODIUM CHLORIDE 0.9 % IV SOLN
2.0000 g | INTRAVENOUS | Status: DC
  Administered 2020-02-19: 2 g via INTRAVENOUS
  Filled 2020-02-19 (×2): qty 20

## 2020-02-19 MED ORDER — FENTANYL CITRATE (PF) 100 MCG/2ML IJ SOLN
INTRAMUSCULAR | Status: AC
  Filled 2020-02-19: qty 2

## 2020-02-19 MED ORDER — SUCCINYLCHOLINE CHLORIDE 20 MG/ML IJ SOLN
INTRAMUSCULAR | Status: DC | PRN
  Administered 2020-02-19: 100 mg via INTRAVENOUS

## 2020-02-19 SURGICAL SUPPLY — 61 items
ANCHOR TIS RET SYS 235ML (MISCELLANEOUS) ×2 IMPLANT
BAG INFUSER PRESSURE 100CC (MISCELLANEOUS) IMPLANT
BLADE SURG SZ11 CARB STEEL (BLADE) ×2 IMPLANT
CANISTER SUCT 1200ML W/VALVE (MISCELLANEOUS) IMPLANT
CANNULA REDUC XI 12-8 STAPL (CANNULA) ×1
CANNULA REDUCER 12-8 DVNC XI (CANNULA) ×1 IMPLANT
CHLORAPREP W/TINT 26 (MISCELLANEOUS) ×2 IMPLANT
COVER TIP SHEARS 8 DVNC (MISCELLANEOUS) ×1 IMPLANT
COVER TIP SHEARS 8MM DA VINCI (MISCELLANEOUS) ×2
COVER WAND RF STERILE (DRAPES) ×2 IMPLANT
DEFOGGER SCOPE WARMER CLEARIFY (MISCELLANEOUS) ×2 IMPLANT
DERMABOND ADVANCED (GAUZE/BANDAGES/DRESSINGS) ×1
DERMABOND ADVANCED .7 DNX12 (GAUZE/BANDAGES/DRESSINGS) ×1 IMPLANT
DRAPE ARM DVNC X/XI (DISPOSABLE) ×4 IMPLANT
DRAPE COLUMN DVNC XI (DISPOSABLE) ×1 IMPLANT
DRAPE DA VINCI XI ARM (DISPOSABLE) ×8
DRAPE DA VINCI XI COLUMN (DISPOSABLE) ×2
ELECT CAUTERY BLADE 6.4 (BLADE) ×2 IMPLANT
ELECT REM PT RETURN 9FT ADLT (ELECTROSURGICAL) ×2
ELECTRODE REM PT RTRN 9FT ADLT (ELECTROSURGICAL) ×1 IMPLANT
GLOVE BIOGEL PI IND STRL 7.0 (GLOVE) ×3 IMPLANT
GLOVE BIOGEL PI INDICATOR 7.0 (GLOVE) ×3
GLOVE SURG SYN 6.5 ES PF (GLOVE) ×6 IMPLANT
GOWN STRL REUS W/ TWL LRG LVL3 (GOWN DISPOSABLE) ×3 IMPLANT
GOWN STRL REUS W/TWL LRG LVL3 (GOWN DISPOSABLE) ×6
GRASPER SUT TROCAR 14GX15 (MISCELLANEOUS) ×2 IMPLANT
IRRIGATOR SUCT 8 DISP DVNC XI (IRRIGATION / IRRIGATOR) IMPLANT
IRRIGATOR SUCTION 8MM XI DISP (IRRIGATION / IRRIGATOR)
IV NS 1000ML (IV SOLUTION)
IV NS 1000ML BAXH (IV SOLUTION) IMPLANT
KIT TURNOVER KIT A (KITS) ×2 IMPLANT
LABEL OR SOLS (LABEL) IMPLANT
MANIFOLD NEPTUNE II (INSTRUMENTS) ×2 IMPLANT
NEEDLE HYPO 22GX1.5 SAFETY (NEEDLE) ×2 IMPLANT
NEEDLE INSUFFLATION 14GA 120MM (NEEDLE) ×2 IMPLANT
OBTURATOR OPTICAL STANDARD 8MM (TROCAR) ×2
OBTURATOR OPTICAL STND 8 DVNC (TROCAR) ×1
OBTURATOR OPTICALSTD 8 DVNC (TROCAR) ×1 IMPLANT
PACK LAP CHOLECYSTECTOMY (MISCELLANEOUS) ×2 IMPLANT
PENCIL ELECTRO HAND CTR (MISCELLANEOUS) ×2 IMPLANT
RELOAD STAPLER 2.5X45 WHT DVNC (STAPLE) ×2 IMPLANT
RELOAD STAPLER 3.5X45 BLU DVNC (STAPLE) ×1 IMPLANT
SEAL CANN UNIV 5-8 DVNC XI (MISCELLANEOUS) ×3 IMPLANT
SEAL XI 5MM-8MM UNIVERSAL (MISCELLANEOUS) ×6
SET TUBE SMOKE EVAC HIGH FLOW (TUBING) ×2 IMPLANT
SOLUTION ELECTROLUBE (MISCELLANEOUS) ×2 IMPLANT
STAPLER 45 DA VINCI SURE FORM (STAPLE) ×2
STAPLER 45 SUREFORM DVNC (STAPLE) ×1 IMPLANT
STAPLER CANNULA SEAL DVNC XI (STAPLE) ×1 IMPLANT
STAPLER CANNULA SEAL XI (STAPLE) ×2
STAPLER RELOAD 2.5X45 WHITE (STAPLE) ×4
STAPLER RELOAD 2.5X45 WHT DVNC (STAPLE) ×2
STAPLER RELOAD 3.5X45 BLU DVNC (STAPLE) ×1
STAPLER RELOAD 3.5X45 BLUE (STAPLE) ×1
SUT MNCRL AB 4-0 PS2 18 (SUTURE) ×4 IMPLANT
SUT VIC AB 3-0 SH 27 (SUTURE) ×2
SUT VIC AB 3-0 SH 27X BRD (SUTURE) ×1 IMPLANT
SUT VICRYL 0 AB UR-6 (SUTURE) ×2 IMPLANT
SYR 30ML LL (SYRINGE) ×2 IMPLANT
SYSTEM WECK SHIELD CLOSURE (TROCAR) IMPLANT
TRAY FOLEY MTR SLVR 16FR STAT (SET/KITS/TRAYS/PACK) IMPLANT

## 2020-02-19 NOTE — H&P (Signed)
Subjective:   CC: Acute appendicitis  HPI:  Jimmy Mills is a 54 y.o. male who is consulted by Roxan Hockey for evaluation of  above cc.  Symptoms were first noted 1 day ago. Pain is sharp, sudden onset, no instigating factor.  Associated with nausea vomiting, exacerbated by nothing specific.  Pain continue to worsen so presented to ED.  Patient denies any change in diet, no sick contacts.  Not feeling hungry but is thirsty.   Last bowel movement was this morning.  Past Medical History:  has a past medical history of Diabetes mellitus without complication (HCC), History of chicken pox (12/30/2014), and Hypertension.  Past Surgical History:  has a past surgical history that includes Cervical fusion (11/10/2014); Back surgery; Shoulder arthroscopy (Right); Cataract extraction (Left, 06/2014); and Colonoscopy with propofol (N/A, 07/19/2019).  Family History: family history includes Cancer in his father; Dementia in his father; Diabetes in his mother; Glaucoma in his mother; Liver cancer in his mother; Pancreatic cancer in his mother.  Social History:  reports that he has never smoked. He has never used smokeless tobacco. He reports that he does not drink alcohol and does not use drugs.  Current Medications:  Prior to Admission medications   Medication Sig Start Date End Date Taking? Authorizing Provider  amLODipine (NORVASC) 5 MG tablet Take 5 mg by mouth daily.   Yes [provider]  cholecalciferol (VITAMIN D3) 25 MCG (1000 UNIT) tablet Take 1,000 Units by mouth daily.   Yes [provider]  glimepiride (AMARYL) 2 MG tablet Take 2 mg by mouth daily as needed (high blood sugar).    Yes [provider]  JARDIANCE 25 MG TABS tablet Take 25 mg by mouth daily. 02/02/19  Yes [provider]  losartan (COZAAR) 100 MG tablet Take 100 mg by mouth daily.   Yes [provider]  metFORMIN (GLUCOPHAGE-XR) 500 MG 24 hr tablet Take 1,000 mg by mouth daily.    Yes  [provider]  Multiple Vitamins-Minerals (MULTIVITAMIN WITH MINERALS) tablet Take 1 tablet by mouth daily.   Yes [provider]  OZEMPIC, 1 MG/DOSE, 2 MG/1.5ML SOPN Inject 1 Dose as directed every Saturday.  12/27/18  Yes [provider]  rosuvastatin (CRESTOR) 5 MG tablet Take 5 mg by mouth every Monday, Wednesday, and Friday.    Yes [provider]  brimonidine (ALPHAGAN) 0.2 % ophthalmic solution Place 1 drop into the right eye 2 (two) times daily.    [provider]  dorzolamide-timolol (COSOPT) 22.3-6.8 MG/ML ophthalmic solution Place 1 drop into the right eye 2 (two) times daily.    [provider]    Allergies:  Allergies as of 02/19/2020 - Review Complete 02/19/2020  Allergen Reaction Noted  . Doxycycline Rash 12/30/2014    ROS:  General: Denies weight loss, weight gain, fatigue, fevers, chills, and night sweats. Eyes: Denies blurry vision, double vision, eye pain, itchy eyes, and tearing. Ears: Denies hearing loss, earache, and ringing in ears. Nose: Denies sinus pain, congestion, infections, runny nose, and nosebleeds. Mouth/throat: Denies hoarseness, sore throat, bleeding gums, and difficulty swallowing. Heart: Denies chest pain, palpitations, racing heart, irregular heartbeat, leg pain or swelling, and decreased activity tolerance. Respiratory: Denies breathing difficulty, shortness of breath, wheezing, cough, and sputum. GI: Denies change in appetite, heartburn, constipation, diarrhea, and blood in stool. GU: Denies difficulty urinating, pain with urinating, urgency, frequency, blood in urine. Musculoskeletal: Denies joint stiffness, pain, swelling, muscle weakness. Skin: Denies rash, itching, mass, tumors, sores, and boils  Neurologic: Denies headache, fainting, dizziness, seizures, numbness, and tingling. Psychiatric: Denies depression, anxiety, difficulty sleeping, and memory loss. Endocrine: Denies heat or cold  intolerance, and increased thirst or urination. Blood/lymph: Denies easy bruising, easy bruising, and swollen glands     Objective:     BP (!) 151/91   Pulse 88   Temp 98 F (36.7 C) (Oral)   Resp 13   Ht 6\' 4"  (1.93 m)   Wt 104.3 kg   SpO2 100%   BMI 28.00 kg/m    Constitutional :  alert, cooperative, appears stated age and no distress  Lymphatics/Throat:  no asymmetry, masses, or scars  Respiratory:  clear to auscultation bilaterally  Cardiovascular:  regular rate and rhythm  Gastrointestinal: Soft, no guarding, tenderness to palpation slightly right to the umbilicus.   Musculoskeletal: Steady gait and movement  Skin: Cool and moist  Psychiatric: Normal affect, non-agitated, not confused       LABS:  CMP Latest Ref Rng & Units 02/19/2020 11/28/2017 11/25/2015  Glucose 70 - 99 mg/dL 11/27/2015) 66 76  BUN 6 - 20 mg/dL 18 14 15   Creatinine 0.61 - 1.24 mg/dL 720(N 4.70  Sodium 135 - 145 mmol/L 138 144 141  Potassium 3.5 - 5.1 mmol/L 3.8 4.5 4.4  Chloride 98 - 111 mmol/L 101 103 101  CO2 22 - 32 mmol/L 24 27 26   Calcium 8.9 - 10.3 mg/dL 9.4 9.8 9.4  Total Protein 6.5 - 8.1 g/dL 7.8 7.4 -  Total Bilirubin 0.3 - 1.2 mg/dL 2.1(H) 1.0 -  Alkaline Phos 38 - 126 U/L 50 53 -  AST 15 - 41 U/L 61(H) 33 -  ALT 0 - 44 U/L 62(H) 27 -   CBC Latest Ref Rng & Units 02/19/2020 11/15/2014  WBC 4.0 - 10.5 K/uL 7.1 6.9  Hemoglobin 13.0 - 17.0 g/dL 12.5(L)  Hematocrit 39 - 52 % 41.3 37.9(L)  Platelets 150 - 400 K/uL 230 276     RADS: CLINICAL DATA:  Abdominal pain.  EXAM: CT ABDOMEN AND PELVIS WITH CONTRAST  TECHNIQUE: Multidetector CT imaging of the abdomen and pelvis was performed using the standard protocol following bolus administration of intravenous contrast.  CONTRAST:  02/21/2020 OMNIPAQUE IOHEXOL 300 MG/ML  SOLN  COMPARISON:  None.  FINDINGS: Lower chest: The lung bases are clear. The heart size is normal.  Hepatobiliary: The liver is normal. Cholelithiasis  without acute inflammation.There is no biliary ductal dilation.  Pancreas: Normal contours without ductal dilatation. No peripancreatic fluid collection.  Spleen: Unremarkable.  Adrenals/Urinary Tract:  --Adrenal glands: Unremarkable.  --Right kidney/ureter: There is a right-sided renal cyst measuring approximately 4 cm.  --Left kidney/ureter: No hydronephrosis or radiopaque kidney stones.  --Urinary bladder: Unremarkable.  Stomach/Bowel:  --Stomach/Duodenum: No hiatal hernia or other gastric abnormality. Normal duodenal course and caliber.  --Small bowel: Unremarkable.  --Colon: Unremarkable.  --Appendix: There is mild fat stranding about the mid to distal appendix. There is a stone in the midportion of the pannus. The appendix measures up to approximately 7-8 mm in the mid section (coronal series 5, image 40 and axial series 2, image 60). There are few pockets of gas within the appendiceal lumen.  Vascular/Lymphatic: Atherosclerotic calcification is present within the non-aneurysmal abdominal aorta, without hemodynamically significant stenosis.  --No retroperitoneal lymphadenopathy.  --No mesenteric lymphadenopathy.  --No pelvic or inguinal lymphadenopathy.  Reproductive: The prostate gland is enlarged.  Other: No ascites or free air. The abdominal wall is normal.  Musculoskeletal. No acute displaced fractures.  IMPRESSION: 1. Findings are suspicious for early acute uncomplicated appendicitis. Correlation with laboratory studies and physical exam is recommended. 2. Cholelithiasis without acute inflammation.  Aortic Atherosclerosis (ICD10-I70.0).   Electronically Signed   By: Katherine Mantle M.D.   On: 02/19/2020 19:48 Assessment:      Acute appendicitis, possibly early onset causing lack of obvious CT findings and no obvious changes in labs.  We did briefly discussed the chance of this being a negative appendicitis, patient  still would like to proceed with the diagnostic laparoscopy.  Plan:      Discussed the risk of surgery including post-op infxn, seroma, hematoma, abscess formation, chronic pain, poor-delayed wound healing, possible bowel resection, possible ostomy, possible conversion to open procedure, post-op SBO or ileus, and need for additional procedures to address said risks.  The risks of general anesthetic including MI, CVA, sudden death or even reaction to anesthetic medications also discussed. Alternatives include continued observation, or antibiotic treatment.  Benefits include possible symptom relief,   Typical post operative recovery of 3-5 days rest, also discussed.  The patient understands the risks, any and all questions were answered to the patient's satisfaction.  IV antibiotics started in the meantime, along with IV fluids, pending transfer to the OR.

## 2020-02-19 NOTE — Op Note (Signed)
Preoperative diagnosis: acute appendicitis  Postoperative diagnosis: Same  Procedure: Robotic assisted laparoscopic appendectomy.  Anesthesia: GETA  Surgeon: Sung Amabile  Wound Classification: clean contaminated  Specimen: Appendix  Complications: None  Estimated Blood Loss: 3 mL   Indications: Patient is a 54 y.o. male  presented with above.  Please see H&P for further details.    FIndings: 1.  Irritated appendix  2. No peri-appendiceal abscess or phlegmon 3. Normal anatomy 4. Appendiceal artery ligated and divided with stapler 5. Adequate hemostasis.   Description of procedure: The patient was placed on the operating table in the supine position, left arm tucked. General anesthesia was induced. A time-out was completed verifying correct patient, procedure, site, positioning, and implant(s) and/or special equipment prior to beginning this procedure. The abdomen was prepped and draped in the usual sterile fashion.   Palmer's point located and Veress needle was inserted.  After confirming 2 clicks and a positive saline drop test, gas insufflation was initiated until the abdominal pressure was measured at 15 mmHg.  Afterwards, the Veress needle was removed and a 8 mm port was placed through a periumbilical site using Optiview technique after incision with an 11 blade.  After local was infused, 2 additional incision was made 8 cm apart each side along the left side of the abdominal wall from the initial incision.  An 8 mm port was caudaed and 34mm port cephalad from initial incision, both under direct visualization.  No injuries from trocar placements were noted. The table was placed in the Trendelenburg position with the right side elevated.  Xi robotic platform was then brought to the operative field and docked.  An inflamed appendix was identified and elevated.  Window created at base of appendix in the mesentery.   A blue load linear cutting stapler was then used to divide and  staple the base of the appendix. It was reloaded with a vascular cartridge and the mesoappendix similarly divided.  No bleeding from the staple lines noted.  The appendix was placed in an endoscopic retrieval bag and removed.   The appendiceal stump and mesoappendix staple line examined again and hemostasis noted. No other pathology was identified within pelvis. The 12 mm trocar removed and port site closed with PMI using 0 vicryl under direct vision. Remaining trocars were removed under direct vision. No bleeding was noted.The abdomen was allowed to collapse. All skin incisions then closed with subcuticular sutures Monocryl 4-0.  Wounds then dressed with dermabond.  The patient tolerated the procedure well, awakened from anesthesia and was taken to the postanesthesia care unit in satisfactory condition.  Sponge count and instrument count correct at the end of the procedure.

## 2020-02-19 NOTE — ED Notes (Signed)
Pt here for N/V and abdominal pain.

## 2020-02-19 NOTE — ED Notes (Signed)
Medication Reconciliation Report  For Home History Technicians  HIGHLIGHTS:  1. The patient WAS personally interviewed 2. If not, what was the main source used: NOT APPLICABLE 3. Does the patient appear to take any anti-coagulation agents (e.g. warfarin, Eliquis or Xarelto): NO 4. Does the patient appear to take any anti-convulsant agents (e.g. divalproex, levetiracetam or phenytoin): NO 5. Does the patient appear to use any insulin products (e.g. Lantus, Novolin or Humalog): NO 6. Does the patient appear to take any "beta-blockers" (e.g. metoprolol, carvedilol or bisoprolol: NO  BARRIERS:  1. Were there any barriers that prevented or complicated the medication reconciliation process: NO 2. If yes, what was the primary barrier encountered: None 3. Does the patient appear compliant with prescribed medications: YES 4. Does the patient express any barriers with compliance: NO 5. What is the primary barrier the patient reports: None   NOTES:[Include any concerns, remarks or complaints the patient expresses regarding medication therapy. Any observations or other information that might be useful to the treatment team can also be included. Immediate needs or concerns should be referred to the RN or appropriate member of the treatment team.]  Patient was interviewed and contributed significantly to home medication history reconciliation. Of note: patient reports using 2 eye drops, but was unable to share what they were. Previous medication lists in [facility] suggest BRIMONIDINE and DORZOLAMIDE-TIMOLOL. No recent pharmacy activity to help corroberate They have been added to the home medication list.   Jimmy Mills, CPhT [place] [facility] [address] [place], [place] [address] [phone]  ** The above is intended solely for informational and/or communicative purposes. It should in no way be considered an endorsement of any specific treatment, therapy or action.  **

## 2020-02-19 NOTE — ED Triage Notes (Signed)
Pt here via POV from home with c/o abdominal pain. Pt states N/V. Pt states this started early this am. Pt states severe mid lower pain.

## 2020-02-19 NOTE — Anesthesia Preprocedure Evaluation (Signed)
Anesthesia Evaluation  Patient identified by MRN, date of birth, ID band Patient awake    Reviewed: Allergy & Precautions, H&P , NPO status , Patient's Chart, lab work & pertinent test results  History of Anesthesia Complications Negative for: history of anesthetic complications  Airway Mallampati: III  TM Distance: >3 FB Neck ROM: limited    Dental  (+) Teeth Intact   Pulmonary neg pulmonary ROS, neg sleep apnea, neg COPD,    breath sounds clear to auscultation       Cardiovascular hypertension, (-) angina(-) Past MI and (-) Cardiac Stents (-) dysrhythmias  Rhythm:regular Rate:Normal     Neuro/Psych H/o cervical fusion negative psych ROS   GI/Hepatic negative GI ROS, Neg liver ROS,   Endo/Other  diabetes  Renal/GU      Musculoskeletal   Abdominal   Peds  Hematology negative hematology ROS (+)   Anesthesia Other Findings Past Medical History: No date: Diabetes mellitus without complication (HCC) 12/30/2014: History of chicken pox     Comment:  DID have Chicken Pox. DID have Mumps.   No date: Hypertension  Past Surgical History: No date: BACK SURGERY 06/2014: CATARACT EXTRACTION; Left 11/10/2014: CERVICAL FUSION 07/19/2019: COLONOSCOPY WITH PROPOFOL; N/A     Comment:  Procedure: COLONOSCOPY WITH PROPOFOL;  Surgeon: Midge Minium, MD;  Location: [facility] ENDOSCOPY;  Service:               Endoscopy;  Laterality: N/A; No date: SHOULDER ARTHROSCOPY; Right  BMI    Body Mass Index: 28.00 kg/m      Reproductive/Obstetrics negative OB ROS                             Anesthesia Physical Anesthesia Plan  ASA: III  Anesthesia Plan: General ETT   Post-op Pain Management:    Induction:   PONV Risk Score and Plan: Ondansetron, Dexamethasone, Midazolam and Treatment may vary due to age or medical condition  Airway Management Planned:   Additional Equipment:   Intra-op  Plan:   Post-operative Plan:   Informed Consent: I have reviewed the patients History and Physical, chart, labs and discussed the procedure including the risks, benefits and alternatives for the proposed anesthesia with the patient or authorized representative who has indicated his/her understanding and acceptance.     Dental Advisory Given  Plan Discussed with: Anesthesiologist, CRNA and Surgeon  Anesthesia Plan Comments:         Anesthesia Quick Evaluation

## 2020-02-19 NOTE — Anesthesia Procedure Notes (Signed)
Procedure Name: Intubation Date/Time: 02/19/2020 10:03 PM Performed by: Jaye Beagle, CRNA Pre-anesthesia Checklist: Patient identified, Emergency Drugs available, Suction available and Patient being monitored Patient Re-evaluated:Patient Re-evaluated prior to induction Oxygen Delivery Method: Circle system utilized Preoxygenation: Pre-oxygenation with 100% oxygen Induction Type: IV induction and Rapid sequence Laryngoscope Size: McGraph and 4 Grade View: Grade II Tube type: Oral Tube size: 7.5 mm Number of attempts: 1 Airway Equipment and Method: Stylet and Oral airway Placement Confirmation: ETT inserted through vocal cords under direct vision,  positive ETCO2 and breath sounds checked- equal and bilateral Secured at: 24 cm Tube secured with: Tape Dental Injury: Teeth and Oropharynx as per pre-operative assessment

## 2020-02-19 NOTE — Transfer of Care (Signed)
Immediate Anesthesia Transfer of Care Note  Patient: Jimmy Mills  Procedure(s) Performed: XI ROBOTIC LAPAROSCOPIC ASSISTED APPENDECTOMY (N/A Abdomen)  Patient Location: PACU  Anesthesia Type:General  Level of Consciousness: drowsy  Airway & Oxygen Therapy: Patient Spontanous Breathing and Patient connected to face mask oxygen  Post-op Assessment: Report given to RN  Post vital signs: stable  Last Vitals:  Vitals Value Taken Time  BP    Temp    Pulse    Resp    SpO2      Last Pain:  Vitals:   02/19/20 2106  TempSrc:   PainSc: 2          Complications: No complications documented.

## 2020-02-19 NOTE — ED Provider Notes (Signed)
[facility] Emergency Department Provider Note    First MD Initiated Contact with Patient 02/19/20 1821     (approximate)  I have reviewed the triage vital signs and the nursing notes.   HISTORY  Chief Complaint Abdominal Pain    HPI Jimmy Mills is a 54 y.o. male below listed past medical history presents to the ER for 12 hours generalized abdominal pain is crampy associated with nausea and vomiting as well as chills.  No known sick contacts.  Is not been able to keep anything down.  Is a type II diabetic.  Does not recall having symptoms like this previously.  No previous abdominal surgeries.  The pain is generalized.    Past Medical History:  Diagnosis Date  . Diabetes mellitus without complication (HCC)   . History of chicken pox 12/30/2014   DID have Chicken Pox. DID have Mumps.    . Hypertension    Family History  Problem Relation Age of Onset  . Diabetes Mother   . Glaucoma Mother   . Liver cancer Mother   . Pancreatic cancer Mother   . Cancer Father        cancer of the prostate  . Dementia Father    Past Surgical History:  Procedure Laterality Date  . BACK SURGERY    . CATARACT EXTRACTION Left 06/2014  . CERVICAL FUSION  11/10/2014  . COLONOSCOPY WITH PROPOFOL N/A 07/19/2019   Procedure: COLONOSCOPY WITH PROPOFOL;  Surgeon: Midge Minium, MD;  Location[facility] ENDOSCOPY;  Service: Endoscopy;  Laterality: N/A;  . SHOULDER ARTHROSCOPY Right    Patient Active Problem List   Diagnosis Date Noted  . Acute appendicitis 02/19/2020  . Encounter for screening colonoscopy   . 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 Immune to measles 11/28/2017  . Constipation 12/30/2014  . Dermatitis, eczematoid 12/30/2014  . Cervical pain 12/30/2014  . Obesity 12/30/2014  . Pure hypercholesterolemia 12/30/2014  . Tenosynovitis of hip 12/30/2014  . Chronic LBP 12/21/2012  . Lumbar radiculopathy 12/21/2012  .  Retinopathy 02/11/2011  . Family history of malignant neoplasm of prostate 06/09/2009  . Irritable bowel syndrome 04/22/2009  . Abdominal pain 10/09/2007  . Diabetic macular edema (HCC) 10/12/2006  . Back strain 10/04/2006  . 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 Essential hypertension 03/28/2002      Prior to Admission medications   Medication Sig Start Date End Date Taking? Authorizing Provider  amLODipine (NORVASC) 5 MG tablet Take 5 mg by mouth daily.   Yes [provider]  cholecalciferol (VITAMIN D3) 25 MCG (1000 UNIT) tablet Take 1,000 Units by mouth daily.   Yes [provider]  glimepiride (AMARYL) 2 MG tablet Take 2 mg by mouth daily as needed (high blood sugar).    Yes [provider]  JARDIANCE 25 MG TABS tablet Take 25 mg by mouth daily. 02/02/19  Yes [provider]  losartan (COZAAR) 100 MG tablet Take 100 mg by mouth daily.   Yes [provider]  metFORMIN (GLUCOPHAGE-XR) 500 MG 24 hr tablet Take 1,000 mg by mouth daily.    Yes [provider]  Multiple Vitamins-Minerals (MULTIVITAMIN WITH MINERALS) tablet Take 1 tablet by mouth daily.   Yes [provider]  OZEMPIC, 1 MG/DOSE, 2 MG/1.5ML SOPN Inject 1 Dose as directed every Saturday.  12/27/18  Yes [provider]  rosuvastatin (CRESTOR) 5 MG tablet Take 5 mg  by mouth every Monday, Wednesday, and Friday.    Yes [provider]  brimonidine (ALPHAGAN) 0.2 % ophthalmic solution Place 1 drop into the right eye 2 (two) times daily.    [provider]  dorzolamide-timolol (COSOPT) 22.3-6.8 MG/ML ophthalmic solution Place 1 drop into the right eye 2 (two) times daily.    [provider]    Allergies Doxycycline    Social History Social History   Tobacco Use  . Smoking status: Never Smoker  . Smokeless tobacco: Never Used  Substance Use Topics  .  Alcohol use: No  . Drug use: No    Review of Systems Patient denies headaches, rhinorrhea, blurry vision, numbness, shortness of breath, chest pain, edema, cough, abdominal pain, nausea, vomiting, diarrhea, dysuria, fevers, rashes or hallucinations unless otherwise stated above in HPI. ____________________________________________   PHYSICAL EXAM:  VITAL SIGNS: Vitals:   02/19/20 1930 02/19/20 1945  BP: (!) 151/91   Pulse: 90 88  Resp: (!) 7 13  Temp:    SpO2: 100% 100%    Constitutional: Alert and oriented.  Eyes: Conjunctivae are normal.  Head: Atraumatic. Nose: No congestion/rhinnorhea. Mouth/Throat: Mucous membranes are moist.   Neck: No stridor. Painless ROM.  Cardiovascular: Normal rate, regular rhythm. Grossly normal heart sounds.  Good peripheral circulation. Respiratory: Normal respiratory effort.  No retractions. Lungs CTAB. Gastrointestinal: Soft but with generalized ttp. No distention. No abdominal bruits. No CVA tenderness. Genitourinary:  Musculoskeletal: No lower extremity tenderness nor edema.  No joint effusions. Neurologic:  Normal speech and language. No gross focal neurologic deficits are appreciated. No facial droop Skin:  Skin is warm, dry and intact. No rash noted. Psychiatric: Mood and affect are normal. Speech and behavior are normal.  ____________________________________________   LABS (all labs ordered are listed, but only abnormal results are displayed)  Results for orders placed or performed during the hospital encounter of 02/19/20 (from the past 24 hour(s))  Lipase, blood     Status: None   Collection Time: 02/19/20  4:49 PM  Result Value Ref Range   Lipase 33 11 - 51 U/L  Comprehensive metabolic panel     Status: Abnormal   Collection Time: 02/19/20  4:49 PM  Result Value Ref Range   Sodium 138 135 - 145 mmol/L   Potassium 3.8 3.5 - 5.1 mmol/L   Chloride 101 98 - 111 mmol/L   CO2 24 22 - 32 mmol/L   Glucose, Bld 159 (H) 70 - 99 mg/dL    BUN 18 6 - 20 mg/dL   Creatinine, Ser 2.24 0.61 - 1.24 mg/dL   Calcium 9.4 8.9 - 82.5 mg/dL   Total Protein 7.8 6.5 - 8.1 g/dL   Albumin 4.7 3.5 - 5.0 g/dL   AST 61 (H) 15 - 41 U/L   ALT 62 (H) 0 - 44 U/L   Alkaline Phosphatase 50 38 - 126 U/L   Total Bilirubin 2.1 (H) 0.3 - 1.2 mg/dL   GFR, Estimated >00 >37 mL/min   Anion gap 13 5 - 15  CBC     Status: None   Collection Time: 02/19/20  4:49 PM  Result Value Ref Range   WBC 7.1 4.0 - 10.5 K/uL   RBC 4.74 4.22 - 5.81 MIL/uL   Hemoglobin 13.9 13.0 - 17.0 g/dL   HCT 04.8 39 - 52 %   MCV 87.1 80.0 - 100.0 fL   MCH 29.3 26.0 - 34.0 pg   MCHC 33.7 30.0 - 36.0 g/dL   RDW  13.2 11.5 - 15.5 %   Platelets 230 150 - 400 K/uL   nRBC 0.0 0.0 - 0.2 %  Urinalysis, Complete w Microscopic     Status: Abnormal   Collection Time: 02/19/20  4:49 PM  Result Value Ref Range   Color, Urine YELLOW (A) YELLOW   APPearance CLEAR (A) CLEAR   Specific Gravity, Urine 1.031 (H) 1.005 - 1.030   pH 6.0 5.0 - 8.0   Glucose, UA >=500 (A) NEGATIVE mg/dL   Hgb urine dipstick NEGATIVE NEGATIVE   Bilirubin Urine NEGATIVE NEGATIVE   Ketones, ur 80 (A) NEGATIVE mg/dL   Protein, ur NEGATIVE NEGATIVE mg/dL   Nitrite NEGATIVE NEGATIVE   Leukocytes,Ua NEGATIVE NEGATIVE   RBC / HPF 0-5 0 - 5 RBC/hpf   WBC, UA 0-5 0 - 5 WBC/hpf   Bacteria, UA NONE SEEN NONE SEEN   Squamous Epithelial / LPF NONE SEEN 0 - 5  Resp Panel by RT-PCR (Flu A&B, Covid) Nasopharyngeal Swab     Status: None   Collection Time: 02/19/20  6:50 PM   Specimen: Nasopharyngeal Swab; Nasopharyngeal(NP) swabs in vial transport medium  Result Value Ref Range   SARS Coronavirus 2 by RT PCR NEGATIVE NEGATIVE   Influenza A by PCR NEGATIVE NEGATIVE   Influenza B by PCR NEGATIVE NEGATIVE   ____________________________________________  EKG My review and personal interpretation at Time: 19:30   Indication: n/v  Rate: 85  Rhythm: sinus Axis: normal Other: normal intervals, no  stemi ____________________________________________  RADIOLOGY  I personally reviewed all radiographic images ordered to evaluate for the above acute complaints and reviewed radiology reports and findings.  These findings were personally discussed with the patient.  Please see medical record for radiology report.  ____________________________________________   PROCEDURES  Procedure(s) performed:  Procedures    Critical Care performed: no ____________________________________________   INITIAL IMPRESSION / ASSESSMENT AND PLAN / ED COURSE  Pertinent labs & imaging results that were available during my care of the patient were reviewed by me and considered in my medical decision making (see chart for details).   DDX: Enteritis, gastritis, diverticulitis, SBO, appendicitis, cholelithiasis, cholecystitis  Jimmy Mills is a 54 y.o. who presents to the ED with presentation as described above.  Patient is ill-appearing with generalized abdominal pain nausea vomiting presentation as described above.  Based on his tenderness will order CT imaging will give IV narcotic pain medication as well as IV antiemetic and IVF.  Clinical Course as of Feb 18 2037  Wed Feb 19, 2020  1955 CT imaging reports consistent findings concerning for early acute appendicitis.  Have consulted Dr. Tonna Boehringer of general surgery who will evaluate patient.   [PR]    Clinical Course User Index [PR] Willy Eddy, MD    The patient was evaluated in Emergency Department today for the symptoms described in the history of present illness. He/she was evaluated in the context of the global COVID-19 pandemic, which necessitated consideration that the patient might be at risk for infection with the SARS-CoV-2 virus that causes COVID-19. Institutional protocols and algorithms that pertain to the evaluation of patients at risk for COVID-19 are in a state of rapid change based on information released by regulatory bodies  including the CDC and federal and state organizations. These policies and algorithms were followed during the patient's care in the ED.  As part of my medical decision making, I reviewed the following data within the electronic MEDICAL RECORD NUMBER Nursing notes reviewed and incorporated, Labs reviewed, notes from  prior ED visits and [place] Controlled Substance Database   ____________________________________________   FINAL CLINICAL IMPRESSION(S) / ED DIAGNOSES  Final diagnoses:  Appendicitis, unspecified appendicitis type      NEW MEDICATIONS STARTED DURING THIS VISIT:  New Prescriptions   No medications on file     Note:  This document was prepared using Dragon voice recognition software and may include unintentional dictation errors.    Willy Eddyobinson, Mujahid Jalomo, MD 02/19/20 2038

## 2020-02-19 NOTE — ED Notes (Signed)
Pt in CT.

## 2020-02-20 ENCOUNTER — Encounter: Payer: Self-pay | Admitting: Surgery

## 2020-02-20 LAB — HIV ANTIBODY (ROUTINE TESTING W REFLEX): HIV Screen 4th Generation wRfx: NONREACTIVE

## 2020-02-20 LAB — GLUCOSE, CAPILLARY
Glucose-Capillary: 123 mg/dL — ABNORMAL HIGH (ref 70–99)
Glucose-Capillary: 125 mg/dL — ABNORMAL HIGH (ref 70–99)

## 2020-02-20 LAB — PHOSPHORUS: Phosphorus: 4.3 mg/dL (ref 2.5–4.6)

## 2020-02-20 LAB — HEMOGLOBIN A1C
Hgb A1c MFr Bld: 6.6 % — ABNORMAL HIGH (ref 4.8–5.6)
Mean Plasma Glucose: 142.72 mg/dL

## 2020-02-20 LAB — MAGNESIUM: Magnesium: 2.1 mg/dL (ref 1.7–2.4)

## 2020-02-20 MED ORDER — INSULIN ASPART 100 UNIT/ML ~~LOC~~ SOLN
0.0000 [IU] | Freq: Three times a day (TID) | SUBCUTANEOUS | Status: DC
  Administered 2020-02-20: 2 [IU] via SUBCUTANEOUS
  Filled 2020-02-20: qty 1

## 2020-02-20 MED ORDER — PROMETHAZINE HCL 25 MG/ML IJ SOLN
INTRAMUSCULAR | Status: AC
  Administered 2020-02-20: 12.5 mg via INTRAVENOUS
  Filled 2020-02-20: qty 1

## 2020-02-20 MED ORDER — ADULT MULTIVITAMIN W/MINERALS CH
1.0000 | ORAL_TABLET | Freq: Every day | ORAL | Status: DC
  Administered 2020-02-20: 1 via ORAL
  Filled 2020-02-20: qty 1

## 2020-02-20 MED ORDER — ONDANSETRON 4 MG PO TBDP: 4.0000 mg | ORAL_TABLET | Freq: Four times a day (QID) | ORAL | Status: DC | PRN

## 2020-02-20 MED ORDER — MORPHINE SULFATE (PF) 2 MG/ML IV SOLN: 2.0000 mg | INTRAVENOUS | Status: DC | PRN

## 2020-02-20 MED ORDER — HYDROCODONE-ACETAMINOPHEN 5-325 MG PO TABS: 1.0000 | ORAL_TABLET | ORAL | Status: DC | PRN

## 2020-02-20 MED ORDER — HYDROCODONE-ACETAMINOPHEN 5-325 MG PO TABS: 1.0000 | ORAL_TABLET | Freq: Four times a day (QID) | ORAL | 0 refills | Status: DC | PRN

## 2020-02-20 MED ORDER — ACETAMINOPHEN 325 MG PO TABS: 650.0000 mg | ORAL_TABLET | Freq: Three times a day (TID) | ORAL | 0 refills | Status: AC | PRN

## 2020-02-20 MED ORDER — DORZOLAMIDE HCL-TIMOLOL MAL 2-0.5 % OP SOLN
1.0000 [drp] | Freq: Two times a day (BID) | OPHTHALMIC | Status: DC
  Filled 2020-02-20: qty 10

## 2020-02-20 MED ORDER — LOSARTAN POTASSIUM 50 MG PO TABS
100.0000 mg | ORAL_TABLET | Freq: Every day | ORAL | Status: DC
  Administered 2020-02-20: 100 mg via ORAL
  Filled 2020-02-20: qty 2

## 2020-02-20 MED ORDER — BRIMONIDINE TARTRATE 0.2 % OP SOLN
1.0000 [drp] | Freq: Two times a day (BID) | OPHTHALMIC | Status: DC
  Filled 2020-02-20: qty 5

## 2020-02-20 MED ORDER — ENOXAPARIN SODIUM 40 MG/0.4ML ~~LOC~~ SOLN: 40.0000 mg | SUBCUTANEOUS | Status: DC

## 2020-02-20 MED ORDER — FENTANYL CITRATE (PF) 100 MCG/2ML IJ SOLN
INTRAMUSCULAR | Status: AC
  Administered 2020-02-20: 50 ug via INTRAVENOUS
  Filled 2020-02-20: qty 2

## 2020-02-20 MED ORDER — ROSUVASTATIN CALCIUM 10 MG PO TABS: 5.0000 mg | ORAL_TABLET | ORAL | Status: DC

## 2020-02-20 MED ORDER — ONDANSETRON HCL 4 MG/2ML IJ SOLN
4.0000 mg | Freq: Four times a day (QID) | INTRAMUSCULAR | Status: DC | PRN
  Administered 2020-02-20: 4 mg via INTRAVENOUS
  Filled 2020-02-20: qty 2

## 2020-02-20 MED ORDER — IBUPROFEN 800 MG PO TABS: 800.0000 mg | ORAL_TABLET | Freq: Three times a day (TID) | ORAL | 0 refills | Status: DC | PRN

## 2020-02-20 MED ORDER — TRAMADOL HCL 50 MG PO TABS: 50.0000 mg | ORAL_TABLET | Freq: Four times a day (QID) | ORAL | Status: DC | PRN

## 2020-02-20 MED ORDER — DOCUSATE SODIUM 100 MG PO CAPS: 100.0000 mg | ORAL_CAPSULE | Freq: Two times a day (BID) | ORAL | 0 refills | Status: AC | PRN

## 2020-02-20 MED ORDER — GLIMEPIRIDE 2 MG PO TABS
2.0000 mg | ORAL_TABLET | Freq: Every day | ORAL | Status: DC | PRN
  Filled 2020-02-20: qty 1

## 2020-02-20 MED ORDER — AMLODIPINE BESYLATE 5 MG PO TABS
5.0000 mg | ORAL_TABLET | Freq: Every day | ORAL | Status: DC
  Administered 2020-02-20: 5 mg via ORAL
  Filled 2020-02-20: qty 1

## 2020-02-20 MED ORDER — VITAMIN D 25 MCG (1000 UNIT) PO TABS
1000.0000 [IU] | ORAL_TABLET | Freq: Every day | ORAL | Status: DC
  Administered 2020-02-20: 1000 [IU] via ORAL
  Filled 2020-02-20: qty 1

## 2020-02-20 MED ORDER — DOCUSATE SODIUM 100 MG PO CAPS: 100.0000 mg | ORAL_CAPSULE | Freq: Two times a day (BID) | ORAL | Status: DC | PRN

## 2020-02-20 MED ORDER — SODIUM CHLORIDE FLUSH 0.9 % IV SOLN
INTRAVENOUS | Status: AC
  Filled 2020-02-20: qty 10

## 2020-02-20 NOTE — Discharge Instructions (Signed)
Laparoscopic Appendectomy, Care After This sheet gives you information about how to care for yourself after your procedure. Your doctor may also give you more specific instructions. If you have problems or questions, contact your doctor. Follow these instructions at home: Care for cuts from surgery (incisions)   Follow instructions from your doctor about how to take care of your cuts from surgery. Make sure you: ? Wash your hands with soap and water before you change your bandage (dressing). If you cannot use soap and water, use hand sanitizer. ? Change your bandage as told by your doctor. ? Leave stitches (sutures), skin glue, or skin tape (adhesive) strips in place. They may need to stay in place for 2 weeks or longer. If tape strips get loose and curl up, you may trim the loose edges. Do not remove tape strips completely unless your doctor says it is okay.  Do not take baths, swim, or use a hot tub until your doctor says it is okay. OK TO SHOWER 24HRS AFTER YOUR SURGERY.   Check your surgical cut area every day for signs of infection. Check for: ? More redness, swelling, or pain. ? More fluid or blood. ? Warmth. ? Pus or a bad smell. Activity  Do not drive or use heavy machinery while taking prescription pain medicine.  Do not play contact sports until your doctor says it is okay.  Do not drive for 24 hours if you were given a medicine to help you relax (sedative).  Rest as needed. Do not return to work or school until your doctor says it is okay. General instructions .  tylenol and advil as needed for discomfort.  Please alternate between the two every four hours as needed for pain.   .  Use narcotics, if prescribed, only when tylenol and motrin is not enough to control pain. .  325-650mg every 8hrs to max of 3000mg/24hrs (including the 325mg in every norco dose) for the tylenol.   .  Advil up to 800mg per dose every 8hrs as needed for pain.    To prevent or treat constipation  while you are taking prescription pain medicine, your doctor may recommend that you: ? Drink enough fluid to keep your pee (urine) clear or pale yellow. ? Take over-the-counter or prescription medicines. ? Eat foods that are high in fiber, such as fresh fruits and vegetables, whole grains, and beans. ? Limit foods that are high in fat and processed sugars, such as fried and sweet foods. Contact a doctor if:  You develop a rash.  You have more redness, swelling, or pain around your surgical cuts.  You have more fluid or blood coming from your surgical cuts.  Your surgical cuts feel warm to the touch.  You have pus or a bad smell coming from your surgical cuts.  You have a fever.  One or more of your surgical cuts breaks open. Get help right away if:  You have trouble breathing.  You have chest pain.  You have pain that is getting worse in your shoulders.  You faint or feel dizzy when you stand.  You have very bad pain in your belly (abdomen).  You are sick to your stomach (nauseous) for more than one day.  You have throwing up (vomiting) that lasts for more than one day.  You have leg pain. This information is not intended to replace advice given to you by your health care provider. Make sure you discuss any questions you have with your   health care provider. Document Released: 12/22/2007 Document Revised: 10/03/2015 Document Reviewed: 08/31/2015 Elsevier Interactive Patient Education  2019 Elsevier Inc.   

## 2020-02-20 NOTE — Discharge Summary (Signed)
Physician Discharge Summary  Patient ID: Jimmy Mills MRN: [id number] DOB/AGE: 04/16/1965 54 y.o.  Admit date: 02/19/2020 Discharge date: 02/20/20 Admission Diagnoses: same  Discharge Diagnoses:  Same as above  Discharged Condition: good  Hospital Course: admitted for above  Underwent robotic assisted lap appy.  Recovered as expected.  Consults: None  Discharge Exam: Blood pressure 131/76, pulse 99, temperature 99.1 F (37.3 C), temperature source Oral, resp. rate 20, height 6\' 4"  (1.93 m), weight 104.3 kg, SpO2 100 %. General appearance: alert, cooperative and no distress GI: soft, no guarding, appropriate TTP around incisions  Disposition:  Discharge disposition: 01-Home or Self Care       Discharge Instructions    Discharge patient   Complete by: As directed    Discharge disposition: 01-Home or Self Care   Discharge patient date: 02/20/2020     Allergies as of 02/20/2020      Reactions   Doxycycline Rash      Medication List    TAKE these medications   acetaminophen 325 MG tablet Commonly known as: Tylenol Take 2 tablets (650 mg total) by mouth every 8 (eight) hours as needed for mild pain.   amLODipine 5 MG tablet Commonly known as: NORVASC Take 5 mg by mouth daily.   brimonidine 0.2 % ophthalmic solution Commonly known as: ALPHAGAN Place 1 drop into the right eye 2 (two) times daily.   cholecalciferol 25 MCG (1000 UNIT) tablet Commonly known as: VITAMIN D3 Take 1,000 Units by mouth daily.   docusate sodium 100 MG capsule Commonly known as: Colace Take 1 capsule (100 mg total) by mouth 2 (two) times daily as needed for up to 10 days for mild constipation.   dorzolamide-timolol 22.3-6.8 MG/ML ophthalmic solution Commonly known as: COSOPT Place 1 drop into the right eye 2 (two) times daily.   glimepiride 2 MG tablet Commonly known as: AMARYL Take 2 mg by mouth daily as needed (high blood sugar).   HYDROcodone-acetaminophen 5-325 MG  tablet Commonly known as: Norco Take 1 tablet by mouth every 6 (six) hours as needed for up to 6 doses for moderate pain.   ibuprofen 800 MG tablet Commonly known as: ADVIL Take 1 tablet (800 mg total) by mouth every 8 (eight) hours as needed for mild pain or moderate pain.   Jardiance 25 MG Tabs tablet Generic drug: empagliflozin Take 25 mg by mouth daily.   losartan 100 MG tablet Commonly known as: COZAAR Take 100 mg by mouth daily.   metFORMIN 500 MG 24 hr tablet Commonly known as: GLUCOPHAGE-XR Take 1,000 mg by mouth daily.   multivitamin with minerals tablet Take 1 tablet by mouth daily.   Ozempic (1 MG/DOSE) 2 MG/1.5ML Sopn Generic drug: Semaglutide (1 MG/DOSE) Inject 1 Dose as directed every Saturday.   rosuvastatin 5 MG tablet Commonly known as: CRESTOR Take 5 mg by mouth every Monday, Wednesday, and Friday.       Follow-up Information    Thursday, Cataleia Gade, DO Follow up in 2 week(s).   Specialty: Surgery Why: post op appy Contact information: [address] [place] [phone]                Total time spent arranging discharge was >37min. Signed: 31m 02/20/2020, 11:17 AM

## 2020-02-20 NOTE — Anesthesia Postprocedure Evaluation (Signed)
Anesthesia Post Note  Patient: Jimmy Mills  Procedure(s) Performed: XI ROBOTIC LAPAROSCOPIC ASSISTED APPENDECTOMY (N/A Abdomen)  Patient location during evaluation: PACU Anesthesia Type: General Level of consciousness: awake and alert Pain management: pain level controlled Vital Signs Assessment: post-procedure vital signs reviewed and stable Respiratory status: spontaneous breathing, nonlabored ventilation and respiratory function stable Cardiovascular status: blood pressure returned to baseline and stable Postop Assessment: no apparent nausea or vomiting Anesthetic complications: no   No complications documented.   Last Vitals:  Vitals:   02/20/20 0049 02/20/20 0126  BP: 135/85 140/83  Pulse: 94 95  Resp: 18 16  Temp: 37.1 C 37.1 C  SpO2: 99% 100%    Last Pain:  Vitals:   02/20/20 0126  TempSrc: Oral  PainSc:                  Karleen Hampshire

## 2020-02-20 NOTE — Progress Notes (Signed)
Jimmy Mills to be D/C'd home per MD order.  Discussed prescriptions and follow up appointments with the patient. Prescriptions given to patient, medication list explained in detail. Pt verbalized understanding.  Allergies as of 02/20/2020       Reactions   Doxycycline Rash        Medication List     TAKE these medications    acetaminophen 325 MG tablet Commonly known as: Tylenol Take 2 tablets (650 mg total) by mouth every 8 (eight) hours as needed for mild pain.   amLODipine 5 MG tablet Commonly known as: NORVASC Take 5 mg by mouth daily.   brimonidine 0.2 % ophthalmic solution Commonly known as: ALPHAGAN Place 1 drop into the right eye 2 (two) times daily.   cholecalciferol 25 MCG (1000 UNIT) tablet Commonly known as: VITAMIN D3 Take 1,000 Units by mouth daily.   docusate sodium 100 MG capsule Commonly known as: Colace Take 1 capsule (100 mg total) by mouth 2 (two) times daily as needed for up to 10 days for mild constipation.   dorzolamide-timolol 22.3-6.8 MG/ML ophthalmic solution Commonly known as: COSOPT Place 1 drop into the right eye 2 (two) times daily.   glimepiride 2 MG tablet Commonly known as: AMARYL Take 2 mg by mouth daily as needed (high blood sugar).   HYDROcodone-acetaminophen 5-325 MG tablet Commonly known as: Norco Take 1 tablet by mouth every 6 (six) hours as needed for up to 6 doses for moderate pain.   ibuprofen 800 MG tablet Commonly known as: ADVIL Take 1 tablet (800 mg total) by mouth every 8 (eight) hours as needed for mild pain or moderate pain.   Jardiance 25 MG Tabs tablet Generic drug: empagliflozin Take 25 mg by mouth daily.   losartan 100 MG tablet Commonly known as: COZAAR Take 100 mg by mouth daily.   metFORMIN 500 MG 24 hr tablet Commonly known as: GLUCOPHAGE-XR Take 1,000 mg by mouth daily.   multivitamin with minerals tablet Take 1 tablet by mouth daily.   Ozempic (1 MG/DOSE) 2 MG/1.5ML Sopn Generic drug:  Semaglutide (1 MG/DOSE) Inject 1 Dose as directed every Saturday.   rosuvastatin 5 MG tablet Commonly known as: CRESTOR Take 5 mg by mouth every Monday, Wednesday, and Friday.        Vitals:   02/20/20 0412 02/20/20 0859  BP: (!) 150/90 131/76  Pulse: 95 99  Resp: 20 20  Temp: 98.2 F (36.8 C) 99.1 F (37.3 C)  SpO2: 100% 100%    Skin clean, dry and intact without evidence of skin break down, no evidence of skin tears noted. IV catheter discontinued intact. Site without signs and symptoms of complications. Dressing and pressure applied. Pt denies pain at this time. No complaints noted.  An After Visit Summary was printed and given to the patient. Patient escorted via WC, and D/C home via private auto.  [name]

## 2020-02-20 NOTE — Progress Notes (Signed)
Patient transferred from PACU via stretcher to hospital bed. Report rec'd at bedside from PACU Nurse. Pain med and phenergan was administered to patient prior to coming to the floor. Patient is currently resting. Will continue to the end of shift.

## 2020-02-20 NOTE — Plan of Care (Signed)

## 2020-02-24 LAB — SURGICAL PATHOLOGY

## 2020-02-28 ENCOUNTER — Other Ambulatory Visit: Payer: Self-pay

## 2020-02-28 ENCOUNTER — Encounter (INDEPENDENT_AMBULATORY_CARE_PROVIDER_SITE_OTHER): Payer: BC Managed Care – PPO | Admitting: Ophthalmology

## 2020-02-28 DIAGNOSIS — I10 Essential (primary) hypertension: Secondary | ICD-10-CM

## 2020-02-28 DIAGNOSIS — E103391 Type 1 diabetes mellitus with moderate nonproliferative diabetic retinopathy without macular edema, right eye: Secondary | ICD-10-CM | POA: Diagnosis not present

## 2020-02-28 DIAGNOSIS — E103592 Type 1 diabetes mellitus with proliferative diabetic retinopathy without macular edema, left eye: Secondary | ICD-10-CM | POA: Diagnosis not present

## 2020-02-28 DIAGNOSIS — E10319 Type 1 diabetes mellitus with unspecified diabetic retinopathy without macular edema: Secondary | ICD-10-CM | POA: Diagnosis not present

## 2020-02-28 DIAGNOSIS — H35033 Hypertensive retinopathy, bilateral: Secondary | ICD-10-CM

## 2020-02-28 LAB — HM DIABETES EYE EXAM

## 2020-05-15 ENCOUNTER — Encounter (INDEPENDENT_AMBULATORY_CARE_PROVIDER_SITE_OTHER): Payer: BC Managed Care – PPO | Admitting: Ophthalmology

## 2020-05-15 ENCOUNTER — Other Ambulatory Visit: Payer: Self-pay

## 2020-05-15 DIAGNOSIS — H35033 Hypertensive retinopathy, bilateral: Secondary | ICD-10-CM | POA: Diagnosis not present

## 2020-05-15 DIAGNOSIS — E103592 Type 1 diabetes mellitus with proliferative diabetic retinopathy without macular edema, left eye: Secondary | ICD-10-CM | POA: Diagnosis not present

## 2020-05-15 DIAGNOSIS — I10 Essential (primary) hypertension: Secondary | ICD-10-CM

## 2020-05-15 DIAGNOSIS — E103391 Type 1 diabetes mellitus with moderate nonproliferative diabetic retinopathy without macular edema, right eye: Secondary | ICD-10-CM

## 2020-05-15 DIAGNOSIS — H43813 Vitreous degeneration, bilateral: Secondary | ICD-10-CM

## 2020-05-19 ENCOUNTER — Encounter: Payer: BC Managed Care – PPO | Admitting: Family Medicine

## 2020-07-03 ENCOUNTER — Ambulatory Visit (INDEPENDENT_AMBULATORY_CARE_PROVIDER_SITE_OTHER): Payer: BC Managed Care – PPO | Admitting: Family Medicine

## 2020-07-03 ENCOUNTER — Other Ambulatory Visit: Payer: Self-pay

## 2020-07-03 ENCOUNTER — Encounter: Payer: Self-pay | Admitting: Family Medicine

## 2020-07-03 VITALS — BP 108/66 | HR 79 | Temp 97.3°F | Ht 76.0 in | Wt 250.0 lb

## 2020-07-03 DIAGNOSIS — E7800 Pure hypercholesterolemia, unspecified: Secondary | ICD-10-CM

## 2020-07-03 DIAGNOSIS — I10 Essential (primary) hypertension: Secondary | ICD-10-CM

## 2020-07-03 DIAGNOSIS — E11311 Type 2 diabetes mellitus with unspecified diabetic retinopathy with macular edema: Secondary | ICD-10-CM

## 2020-07-03 DIAGNOSIS — K5909 Other constipation: Secondary | ICD-10-CM | POA: Diagnosis not present

## 2020-07-03 DIAGNOSIS — Z125 Encounter for screening for malignant neoplasm of prostate: Secondary | ICD-10-CM

## 2020-07-03 DIAGNOSIS — Z0000 Encounter for general adult medical examination without abnormal findings: Secondary | ICD-10-CM

## 2020-07-03 DIAGNOSIS — Z1159 Encounter for screening for other viral diseases: Secondary | ICD-10-CM

## 2020-07-03 DIAGNOSIS — Z794 Long term (current) use of insulin: Secondary | ICD-10-CM

## 2020-07-03 MED ORDER — LINACLOTIDE 145 MCG PO CAPS: 145.0000 ug | ORAL_CAPSULE | Freq: Every day | ORAL | 3 refills | Status: DC

## 2020-07-03 NOTE — Progress Notes (Signed)
Complete physical exam   Patient: Jimmy Mills   DOB: 06-23-1965   55 y.o. Male  MRN: [id number] Visit Date: 07/03/2020  Today's healthcare provider: Mila Merry, MD   Chief Complaint  Patient presents with  . Annual Exam  . Diabetes  . Hypertension   Subjective    Jimmy Mills is a 55 y.o. male who presents today for a complete physical exam.  He reports consuming a general diet. Home exercise routine includes running 7 days each week. He generally feels fairly well. He reports sleeping fairly well. He does not have additional problems to discuss today.  HPI  02/25/2019 PSA-0.5 07/19/2019 COLONOSCOPY-NORMAL, REPEAT IN 10 YEARS  Hypertension, follow-up  BP Readings from Last 3 Encounters:  02/20/20 131/76  07/19/19 102/68  02/25/19 100/68   Wt Readings from Last 3 Encounters:  02/19/20 230 lb (104.3 kg)  07/19/19 235 lb (106.6 kg)  06/19/19 237 lb (107.5 kg)     He was last seen for hypertension more than 1 year ago. Management since that visit includes continuing same medications.  He reports good compliance with treatment. He is not having side effects.  He is following a Regular diet. He is exercising. He does not smoke.  Use of agents associated with hypertension: none.   Outside blood pressures are not checked. Symptoms: No chest pain No chest pressure  No palpitations No syncope  No dyspnea No orthopnea  No paroxysmal nocturnal dyspnea No lower extremity edema   Pertinent labs: Lab Results  Component Value Date   CHOL 139 10/12/2016   HDL 49 10/12/2016   LDLCALC 80 10/12/2016   TRIG 49 10/12/2016   Lab Results  Component Value Date   NA 138 02/19/2020   K 3.8 02/19/2020   CREATININE 0.92 02/19/2020   GFRNONAA >60 02/19/2020   GFRAA 95 11/28/2017   GLUCOSE 159 (H) 02/19/2020     The 10-year ASCVD risk score Jimmy George DC Jr., et al., 2013) is: 11.9%    ---------------------------------------------------------------------------------------------------  Diabetes Mellitus Type II, Follow-up  Lab Results  Component Value Date   HGBA1C 6.6 (H) 02/20/2020   HGBA1C 6.5 01/16/2019   HGBA1C 6.1 10/12/2016   Wt Readings from Last 3 Encounters:  02/19/20 230 lb (104.3 kg)  07/19/19 235 lb (106.6 kg)  06/19/19 237 lb (107.5 kg)   This problem is managed by Endocrinology Dr. Gershon Crane with last a1c of 6.8 in October.  He reports good compliance with treatment. He is not having side effects.   Pertinent Labs: Lab Results  Component Value Date   CHOL 139 10/12/2016   HDL 49 10/12/2016   LDLCALC 80 10/12/2016   TRIG 49 10/12/2016   Lab Results  Component Value Date   NA 138 02/19/2020   K 3.8 02/19/2020   CREATININE 0.92 02/19/2020   GFRNONAA >60 02/19/2020   GFRAA 95 11/28/2017   GLUCOSE 159 (H) 02/19/2020     ---------------------------------------------------------------------------------------------------  Lipid/Cholesterol, Follow-up  Last lipid panel Other pertinent labs  Lab Results  Component Value Date   CHOL 139 10/12/2016   HDL 49 10/12/2016   LDLCALC 80 10/12/2016   TRIG 49 10/12/2016   Lab Results  Component Value Date   ALT 62 (H) 02/19/2020   AST 61 (H) 02/19/2020   PLT 230 02/19/2020   TSH 2.63 09/17/2012     He was last seen for this more than 1 year ago. Management since that visit includes continue same medication.  He reports good compliance with  treatment. He is not having side effects.   Symptoms: No chest pain No chest pressure/discomfort  No dyspnea No lower extremity edema  No numbness or tingling of extremity No orthopnea  No palpitations No paroxysmal nocturnal dyspnea  No speech difficulty No syncope   Current diet: well balanced Current exercise: running/ jogging  The 10-year ASCVD risk score Jimmy George DC Jr., et al., 2013) is:  11.9%  --------------------------------------------------------------------------------------------------- His only complaint today is a long history of chronic constipation which has gotten more persistent in the last few years. He had unremarkable colonoscopy in April of 2021.  He has tried OTC stool softeners and fiber supplement without relief. He states he tried samples of Linzess a few years ago which was effective.   Past Medical History:  Diagnosis Date  . Diabetes mellitus without complication (HCC)   . History of chicken pox 12/30/2014   DID have Chicken Pox. DID have Mumps.    . Hypertension    Past Surgical History:  Procedure Laterality Date  . BACK SURGERY    . CATARACT EXTRACTION Left 06/2014  . CERVICAL FUSION  11/10/2014  . COLONOSCOPY WITH PROPOFOL N/A 07/19/2019   Procedure: COLONOSCOPY WITH PROPOFOL;  Surgeon: Midge Minium, MD;  Location: [facility];  Service: Endoscopy;  Laterality: N/A;  . SHOULDER ARTHROSCOPY Right   . XI ROBOTIC LAPAROSCOPIC ASSISTED APPENDECTOMY N/A 02/19/2020   Procedure: XI ROBOTIC LAPAROSCOPIC ASSISTED APPENDECTOMY;  Surgeon: Sung Amabile, DO;  Location: [facility] ORS;  Service: General;  Laterality: N/A;   Social History   Socioeconomic History  . Marital status: Divorced    Spouse name: Not on file  . Number of children: 2  . Years of education: Not on file  . Highest education level: Not on file  Occupational History    Employer: [employer]    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  Tobacco Use  . Smoking status: Never Smoker  . Smokeless tobacco: Never Used  Substance and Sexual Activity  . Alcohol use: No  . Drug use: No  . Sexual activity: Yes  Other Topics Concern  . Not on file  Social History Narrative  . Not on file   Social Determinants of Health   Financial Resource Strain: Not on file  Food Insecurity: Not on file  Transportation Needs: Not on file  Physical Activity: Not on file  Stress: Not on file  Social  Connections: Not on file  Intimate Partner Violence: Not on file   Family Status  Relation Name Status  . Mother  Deceased  . Father  Deceased  . Sister  Alive  . Daughter  Alive  . Son  Alive  . Sister  Alive   Family History  Problem Relation Age of Onset  . Diabetes Mother   . Glaucoma Mother   . Liver cancer Mother   . Pancreatic cancer Mother   . Cancer Father        cancer of the prostate  . Dementia Father    Allergies  Allergen Reactions  . Doxycycline Rash    Patient Care Team: Malva Limes, MD as PCP - General (Family Medicine) Sherrie George, MD as Consulting Physician (Ophthalmology) Sherlon Handing, MD as Consulting Physician (Endocrinology) Mateo Flow, MD as Consulting Physician (Ophthalmology)   Medications: Outpatient Medications Prior to Visit  Medication Sig  . amLODipine (NORVASC) 5 MG tablet Take 5 mg by mouth daily.  . brimonidine (ALPHAGAN) 0.2 % ophthalmic solution Place 1 drop into  the right eye 2 (two) times daily.  . cholecalciferol (VITAMIN D3) 25 MCG (1000 UNIT) tablet Take 1,000 Units by mouth daily.  . dorzolamide-timolol (COSOPT) 22.3-6.8 MG/ML ophthalmic solution Place 1 drop into the right eye 2 (two) times daily.  Marland Kitchen glimepiride (AMARYL) 2 MG tablet Take 2 mg by mouth daily as needed (high blood sugar).   Marland Kitchen HYDROcodone-acetaminophen (NORCO) 5-325 MG tablet Take 1 tablet by mouth every 6 (six) hours as needed for up to 6 doses for moderate pain.  Marland Kitchen ibuprofen (ADVIL) 800 MG tablet Take 1 tablet (800 mg total) by mouth every 8 (eight) hours as needed for mild pain or moderate pain.  Marland Kitchen JARDIANCE 25 MG TABS tablet Take 25 mg by mouth daily.  Marland Kitchen losartan (COZAAR) 100 MG tablet Take 100 mg by mouth daily.  . metFORMIN (GLUCOPHAGE-XR) 500 MG 24 hr tablet Take 1,000 mg by mouth daily.   . Multiple Vitamins-Minerals (MULTIVITAMIN WITH MINERALS) tablet Take 1 tablet by mouth daily.  Marland Kitchen OZEMPIC, 1 MG/DOSE, 2 MG/1.5ML SOPN Inject 1 Dose  as directed every Saturday.   . rosuvastatin (CRESTOR) 5 MG tablet Take 5 mg by mouth every Monday, Wednesday, and Friday.    No facility-administered medications prior to visit.    Review of Systems  Constitutional: Negative for appetite change, chills, fatigue and fever.  HENT: Negative for congestion, ear pain, hearing loss, nosebleeds and trouble swallowing.   Eyes: Negative for pain and visual disturbance.  Respiratory: Negative for cough, chest tightness and shortness of breath.   Cardiovascular: Negative for chest pain, palpitations and leg swelling.  Gastrointestinal: Positive for constipation. Negative for abdominal pain, blood in stool, diarrhea, nausea and vomiting.  Endocrine: Negative for polydipsia, polyphagia and polyuria.  Genitourinary: Negative for dysuria and flank pain.  Musculoskeletal: Negative for arthralgias, back pain, joint swelling, myalgias and neck stiffness.  Skin: Negative for color change, rash and wound.  Neurological: Negative for dizziness, tremors, seizures, speech difficulty, weakness, light-headedness and headaches.  Psychiatric/Behavioral: Negative for behavioral problems, confusion, decreased concentration, dysphoric mood and sleep disturbance. The patient is not nervous/anxious.   All other systems reviewed and are negative.     Objective    BP 108/66 (BP Location: Left Arm, Patient Position: Sitting, Cuff Size: Large)   Pulse 79   Temp (!) 97.3 F (36.3 C) (Temporal)   Ht 6\' 4"  (1.93 m)   Wt 250 lb (113.4 kg)   BMI 30.43 kg/m    Physical Exam    General Appearance:    Obese male. Alert, cooperative, in no acute distress, appears stated age  Head:    Normocephalic, without obvious abnormality, atraumatic  Eyes:    PERRL, conjunctiva/corneas clear, EOM's intact, fundi    benign, both eyes       Ears:    Normal TM's and external ear canals, both ears  Neck:   Supple, symmetrical, trachea midline, no adenopathy;       thyroid:  No  enlargement/tenderness/nodules; no carotid   bruit or JVD  Back:     Symmetric, no curvature, ROM normal, no CVA tenderness  Lungs:     Clear to auscultation bilaterally, respirations unlabored  Chest wall:    No tenderness or deformity  Heart:    Normal heart rate. Normal rhythm. No murmurs, rubs, or gallops.  S1 and S2 normal  Abdomen:     Soft, non-tender, bowel sounds active all four quadrants,    no masses, no organomegaly  Genitalia:    deferred  Rectal:    deferred  Extremities:   All extremities are intact. No cyanosis or edema  Pulses:   2+ and symmetric all extremities  Skin:   Skin color, texture, turgor normal, no rashes or lesions  Lymph nodes:   Cervical, supraclavicular, and axillary nodes normal  Neurologic:   CNII-XII intact. Normal strength, sensation and reflexes      throughout     Last depression screening scores PHQ 2/9 Scores 02/25/2019 02/25/2019 11/24/2016  PHQ - 2 Score 0 0 0  PHQ- 9 Score 0 - -   Last fall risk screening Fall Risk  11/24/2016  Falls in the past year? No   Last Audit-C alcohol use screening Alcohol Use Disorder Test (AUDIT) 02/25/2019  1. How often do you have a drink containing alcohol? 0  2. How many drinks containing alcohol do you have on a typical day when you are drinking? 0  3. How often do you have six or more drinks on one occasion? 0  AUDIT-C Score 0  Alcohol Brief Interventions/Follow-up AUDIT Score <7 follow-up not indicated   A score of 3 or more in women, and 4 or more in men indicates increased risk for alcohol abuse, EXCEPT if all of the points are from question 1   No results found for any visits on 07/03/20.  Assessment & Plan    Routine Health Maintenance and Physical Exam  Exercise Activities and Dietary recommendations Goals   None     Immunization History  Administered Date(s) Administered  . Influenza,inj,Quad PF,6+ Mos 11/24/2016, 11/28/2017, 12/05/2018  . Influenza-Unspecified 12/27/2014  .  PFIZER(Purple Top)SARS-COV-2 Vaccination 05/27/2019, 06/17/2019, 12/21/2019  . Pneumococcal Polysaccharide-23 02/24/2011  . Tdap 02/24/2011  . Zoster Recombinat (Shingrix) 11/28/2017, 01/29/2018    Health Maintenance  Topic Date Due  . Hepatitis C Screening  Never done  . FOOT EXAM  Never done  . OPHTHALMOLOGY EXAM  07/25/2015  . HEMOGLOBIN A1C  08/19/2020  . INFLUENZA VACCINE  10/26/2020  . TETANUS/TDAP  02/23/2021  . COLONOSCOPY (Pts 45-307yrs Insurance coverage will need to be confirmed)  07/18/2029  . PNEUMOCOCCAL POLYSACCHARIDE VACCINE AGE 60-64 HIGH RISK  Completed  . COVID-19 Vaccine  Completed  . HIV Screening  Completed  . HPV VACCINES  Aged Out    Discussed health benefits of physical activity, and encouraged him to engage in regular exercise appropriate for his age and condition.  1. Annual physical exam  - Comprehensive metabolic panel - Hepatitis C antibody - Lipid Panel With LDL/HDL Ratio - PSA  2. Chronic constipation No relief with OTC stool softeners and fiber supplements. Encouraged to continue daily fiber supplement, plenty of fluids and add- linaclotide (LINZESS) 145 MCG CAPS capsule; Take 1 capsule (145 mcg total) by mouth daily before breakfast.  Dispense: 90 capsule; Refill: 3  3. Prostate cancer screening  - PSA  4. 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  Follow up Dr. Analeia Ismael Proseconnell later this month as scheduled.   5. Essential hypertension Well controlled.  Today's reading is lower than his usual BP. Continue current medications.   - Comprehensive metabolic panel  6. Pure hypercholesterolemia He is tolerating rosuvastatin well with no adverse effects.   - Lipid Panel With LDL/HDL Ratio  7. Encounter for hepatitis C screening test for low risk patient  - Hepatitis C antibody        The entirety of the  information  documented in the History of Present Illness, Review of Systems and Physical Exam were personally obtained by me. Portions of this information were initially documented by the CMA and reviewed by me for thoroughness and accuracy.      Lelon Huh, MD  [facility] [phone] (phone) [phone] (fax)  [place]

## 2020-07-03 NOTE — Patient Instructions (Addendum)
Take a full dose of Metamucil fiber supplement every day  Preventive Care 38-56 Years Old, Male Preventive care refers to lifestyle choices and visits with your health care provider that can promote health and wellness. This includes:  A yearly physical exam. This is also called an annual wellness visit.  Regular dental and eye exams.  Immunizations.  Screening for certain conditions.  Healthy lifestyle choices, such as: ? Eating a healthy diet. ? Getting regular exercise. ? Not using drugs or products that contain nicotine and tobacco. ? Limiting alcohol use. What can I expect for my preventive care visit? Physical exam Your health care provider will check your:  Height and weight. These may be used to calculate your BMI (body mass index). BMI is a measurement that tells if you are at a healthy weight.  Heart rate and blood pressure.  Body temperature.  Skin for abnormal spots. Counseling Your health care provider may ask you questions about your:  Past medical problems.  Family's medical history.  Alcohol, tobacco, and drug use.  Emotional well-being.  Home life and relationship well-being.  Sexual activity.  Diet, exercise, and sleep habits.  Work and work Astronomer.  Access to firearms. What immunizations do I need? Vaccines are usually given at various ages, according to a schedule. Your health care provider will recommend vaccines for you based on your age, medical history, and lifestyle or other factors, such as travel or where you work.   What tests do I need? Blood tests  Lipid and cholesterol levels. These may be checked every 5 years, or more often if you are over 67 years old.  Hepatitis C test.  Hepatitis B test. Screening  Lung cancer screening. You may have this screening every year starting at age 77 if you have a 30-pack-year history of smoking and currently smoke or have quit within the past 15 years.  Prostate cancer screening.  Recommendations will vary depending on your family history and other risks.  Genital exam to check for testicular cancer or hernias.  Colorectal cancer screening. ? All adults should have this screening starting at age 60 and continuing until age 43. ? Your health care provider may recommend screening at age 53 if you are at increased risk. ? You will have tests every 1-10 years, depending on your results and the type of screening test.  Diabetes screening. ? This is done by checking your blood sugar (glucose) after you have not eaten for a while (fasting). ? You may have this done every 1-3 years.  STD (sexually transmitted disease) testing, if you are at risk. Follow these instructions at home: Eating and drinking  Eat a diet that includes fresh fruits and vegetables, whole grains, lean protein, and low-fat dairy products.  Take vitamin and mineral supplements as recommended by your health care provider.  Do not drink alcohol if your health care provider tells you not to drink.  If you drink alcohol: ? Limit how much you have to 0-2 drinks a day. ? Be aware of how much alcohol is in your drink. In the [place] one drink equals one 12 oz bottle of beer (355 mL), one 5 oz glass of wine (148 mL), or one 1 oz glass of hard liquor (44 mL).   Lifestyle  Take daily care of your teeth and gums. Brush your teeth every morning and night with fluoride toothpaste. Floss one time each day.  Stay active. Exercise for at least 30 minutes 5 or more days  each week.  Do not use any products that contain nicotine or tobacco, such as cigarettes, e-cigarettes, and chewing tobacco. If you need help quitting, ask your health care provider.  Do not use drugs.  If you are sexually active, practice safe sex. Use a condom or other form of protection to prevent STIs (sexually transmitted infections).  If told by your health care provider, take low-dose aspirin daily starting at age 56.  Find healthy ways  to cope with stress, such as: ? Meditation, yoga, or listening to music. ? Journaling. ? Talking to a trusted person. ? Spending time with friends and family. Safety  Always wear your seat belt while driving or riding in a vehicle.  Do not drive: ? If you have been drinking alcohol. Do not ride with someone who has been drinking. ? When you are tired or distracted. ? While texting.  Wear a helmet and other protective equipment during sports activities.  If you have firearms in your house, make sure you follow all gun safety procedures. What's next?  Go to your health care provider once a year for an annual wellness visit.  Ask your health care provider how often you should have your eyes and teeth checked.  Stay up to date on all vaccines. This information is not intended to replace advice given to you by your health care provider. Make sure you discuss any questions you have with your health care provider. Document Revised: 12/11/2018 Document Reviewed: 03/08/2018 Elsevier Patient Education  2021 [employer].

## 2020-07-07 ENCOUNTER — Encounter: Payer: Self-pay | Admitting: Family Medicine

## 2020-07-08 DIAGNOSIS — Z125 Encounter for screening for malignant neoplasm of prostate: Secondary | ICD-10-CM | POA: Diagnosis not present

## 2020-07-08 DIAGNOSIS — Z1159 Encounter for screening for other viral diseases: Secondary | ICD-10-CM | POA: Diagnosis not present

## 2020-07-08 DIAGNOSIS — E7800 Pure hypercholesterolemia, unspecified: Secondary | ICD-10-CM | POA: Diagnosis not present

## 2020-07-08 DIAGNOSIS — Z0000 Encounter for general adult medical examination without abnormal findings: Secondary | ICD-10-CM | POA: Diagnosis not present

## 2020-07-08 DIAGNOSIS — I10 Essential (primary) hypertension: Secondary | ICD-10-CM | POA: Diagnosis not present

## 2020-07-09 LAB — COMPREHENSIVE METABOLIC PANEL
ALT: 16 IU/L (ref 0–44)
AST: 20 IU/L (ref 0–40)
Albumin/Globulin Ratio: 1.7 (ref 1.2–2.2)
Albumin: 4.5 g/dL (ref 3.8–4.9)
Alkaline Phosphatase: 63 IU/L (ref 44–121)
BUN/Creatinine Ratio: 14 (ref 9–20)
BUN: 15 mg/dL (ref 6–24)
Bilirubin Total: 1.2 mg/dL (ref 0.0–1.2)
CO2: 23 mmol/L (ref 20–29)
Calcium: 9.1 mg/dL (ref 8.7–10.2)
Chloride: 103 mmol/L (ref 96–106)
Creatinine, Ser: 1.08 mg/dL (ref 0.76–1.27)
Globulin, Total: 2.6 g/dL (ref 1.5–4.5)
Glucose: 110 mg/dL — ABNORMAL HIGH (ref 65–99)
Potassium: 4.4 mmol/L (ref 3.5–5.2)
Sodium: 139 mmol/L (ref 134–144)
Total Protein: 7.1 g/dL (ref 6.0–8.5)
eGFR: 81 mL/min/{1.73_m2} (ref >59)

## 2020-07-09 LAB — PSA: Prostate Specific Ag, Serum: 0.5 ng/mL (ref 0.0–4.0)

## 2020-07-09 LAB — LIPID PANEL WITH LDL/HDL RATIO
Cholesterol, Total: 163 mg/dL (ref 100–199)
HDL: 57 mg/dL (ref >39)
LDL Chol Calc (NIH): 95 mg/dL (ref 0–99)
LDL/HDL Ratio: 1.7 ratio (ref 0.0–3.6)
Triglycerides: 54 mg/dL (ref 0–149)
VLDL Cholesterol Cal: 11 mg/dL (ref 5–40)

## 2020-07-09 LAB — HEPATITIS C ANTIBODY: Hep C Virus Ab: <0.1 s/co ratio (ref 0.0–0.9)

## 2020-07-10 ENCOUNTER — Telehealth: Payer: Self-pay

## 2020-07-10 NOTE — Telephone Encounter (Signed)
Copied from CRM [id number]. Topic: Appointment Scheduling - Scheduling Inquiry for Clinic >> Jul 10, 2020  8:38 AM Jimmy Mills A wrote: Reason for CRM: Patient would like to be seen in office to receive their second COVID booster vaccine  Agent was unable to successfully schedule office visit at the time of call  Please contact to further advise scheduling

## 2020-07-10 NOTE — Telephone Encounter (Signed)
Please schedule for covid booster.

## 2020-07-15 ENCOUNTER — Other Ambulatory Visit: Payer: Self-pay

## 2020-07-15 ENCOUNTER — Ambulatory Visit (INDEPENDENT_AMBULATORY_CARE_PROVIDER_SITE_OTHER): Payer: BC Managed Care – PPO

## 2020-07-15 DIAGNOSIS — Z23 Encounter for immunization: Secondary | ICD-10-CM

## 2020-07-29 DIAGNOSIS — Z794 Long term (current) use of insulin: Secondary | ICD-10-CM | POA: Diagnosis not present

## 2020-07-29 DIAGNOSIS — E1142 Type 2 diabetes mellitus with diabetic polyneuropathy: Secondary | ICD-10-CM | POA: Diagnosis not present

## 2020-07-29 DIAGNOSIS — E1169 Type 2 diabetes mellitus with other specified complication: Secondary | ICD-10-CM | POA: Diagnosis not present

## 2020-07-29 DIAGNOSIS — E1159 Type 2 diabetes mellitus with other circulatory complications: Secondary | ICD-10-CM | POA: Diagnosis not present

## 2020-08-07 DIAGNOSIS — S76012A Strain of muscle, fascia and tendon of left hip, initial encounter: Secondary | ICD-10-CM | POA: Diagnosis not present

## 2020-08-14 ENCOUNTER — Other Ambulatory Visit: Payer: Self-pay

## 2020-08-14 ENCOUNTER — Encounter (INDEPENDENT_AMBULATORY_CARE_PROVIDER_SITE_OTHER): Payer: BC Managed Care – PPO | Admitting: Ophthalmology

## 2020-08-14 DIAGNOSIS — E103592 Type 1 diabetes mellitus with proliferative diabetic retinopathy without macular edema, left eye: Secondary | ICD-10-CM | POA: Diagnosis not present

## 2020-08-14 DIAGNOSIS — E103391 Type 1 diabetes mellitus with moderate nonproliferative diabetic retinopathy without macular edema, right eye: Secondary | ICD-10-CM | POA: Diagnosis not present

## 2020-08-14 DIAGNOSIS — H43813 Vitreous degeneration, bilateral: Secondary | ICD-10-CM

## 2020-08-14 DIAGNOSIS — H35033 Hypertensive retinopathy, bilateral: Secondary | ICD-10-CM | POA: Diagnosis not present

## 2020-08-14 DIAGNOSIS — I10 Essential (primary) hypertension: Secondary | ICD-10-CM

## 2020-10-19 ENCOUNTER — Ambulatory Visit: Payer: BC Managed Care – PPO | Admitting: Family Medicine

## 2020-10-19 ENCOUNTER — Other Ambulatory Visit: Payer: Self-pay

## 2020-10-19 ENCOUNTER — Encounter: Payer: Self-pay | Admitting: Family Medicine

## 2020-10-19 VITALS — BP 108/66 | HR 85 | Temp 97.3°F | Resp 18 | Wt 244.8 lb

## 2020-10-19 DIAGNOSIS — H6692 Otitis media, unspecified, left ear: Secondary | ICD-10-CM

## 2020-10-19 MED ORDER — AMOXICILLIN 875 MG PO TABS: 875.0000 mg | ORAL_TABLET | Freq: Two times a day (BID) | ORAL | 0 refills | Status: AC

## 2020-10-19 NOTE — Progress Notes (Signed)
Established patient visit   Patient: Jimmy Mills   DOB: 18-Jan-1966   55 y.o. Male  MRN: [id number] Visit Date: 10/19/2020  Today's healthcare provider: Mila Merry, MD   Chief Complaint  Patient presents with   Ear Pain   Subjective    Otalgia  There is pain in the left ear. This is a new problem. Episode onset: 2 days ago. The problem has been gradually worsening. There has been no fever. Associated symptoms include coughing, headaches, hearing loss and rhinorrhea. Pertinent negatives include no abdominal pain, diarrhea, ear discharge or vomiting. He has tried nothing for the symptoms.   Patient states that symptoms started out as pressure in his left ear after riding on a 10 hour flight on 10/03/2020. This past weekend symptoms progressed into ear pain and hearing loss in his left ear. Patient has had 3 negative COVID test.      Medications: Outpatient Medications Prior to Visit  Medication Sig   amLODipine (NORVASC) 5 MG tablet Take 5 mg by mouth daily.   brimonidine (ALPHAGAN) 0.2 % ophthalmic solution Place 1 drop into the right eye 2 (two) times daily.   cholecalciferol (VITAMIN D3) 25 MCG (1000 UNIT) tablet Take 1,000 Units by mouth daily.   dorzolamide-timolol (COSOPT) 22.3-6.8 MG/ML ophthalmic solution Place 1 drop into the right eye 2 (two) times daily.   glimepiride (AMARYL) 2 MG tablet Take 2 mg by mouth daily as needed (high blood sugar).    HYDROcodone-acetaminophen (NORCO) 5-325 MG tablet Take 1 tablet by mouth every 6 (six) hours as needed for up to 6 doses for moderate pain.   ibuprofen (ADVIL) 800 MG tablet Take 1 tablet (800 mg total) by mouth every 8 (eight) hours as needed for mild pain or moderate pain.   JARDIANCE 25 MG TABS tablet Take 25 mg by mouth daily.   linaclotide (LINZESS) 145 MCG CAPS capsule Take 1 capsule (145 mcg total) by mouth daily before breakfast.   losartan (COZAAR) 100 MG tablet Take 100 mg by mouth daily.   metFORMIN  (GLUCOPHAGE-XR) 500 MG 24 hr tablet Take 1,000 mg by mouth daily.    Multiple Vitamins-Minerals (MULTIVITAMIN WITH MINERALS) tablet Take 1 tablet by mouth daily.   OZEMPIC, 1 MG/DOSE, 2 MG/1.5ML SOPN Inject 1 Dose as directed every Saturday.    rosuvastatin (CRESTOR) 5 MG tablet Take 5 mg by mouth every Monday, Wednesday, and Friday.    No facility-administered medications prior to visit.    Review of Systems  Constitutional:  Negative for appetite change, chills and fever.  HENT:  Positive for ear pain, hearing loss and rhinorrhea. Negative for ear discharge.   Respiratory:  Positive for cough. Negative for chest tightness, shortness of breath and wheezing.   Cardiovascular:  Negative for chest pain and palpitations.  Gastrointestinal:  Negative for abdominal pain, diarrhea, nausea and vomiting.  Neurological:  Positive for headaches.      Objective    BP 108/66 (BP Location: Left Arm, Patient Position: Sitting, Cuff Size: Large)   Pulse 85   Temp (!) 97.3 F (36.3 C) (Temporal)   Resp 18   Wt 244 lb 12.8 oz (111 kg)   BMI 29.80 kg/m     Physical Exam  General Appearance:     Well developed, well nourished male, alert, cooperative, in no acute distress  HENT:   Left tm red and bulging. No erythema or swelling of canal.   Eyes:    PERRL, conjunctiva/corneas clear, EOM's  intact       Lungs:     Clear to auscultation bilaterally, respirations unlabored  Heart:    Normal heart rate. Normal rhythm. No murmurs, rubs, or gallops.   Neurologic:   Awake, alert, oriented x 3. No apparent focal neurological           defect.        Assessment & Plan     1. Otitis of left ear - amoxicillin (AMOXIL) 875 MG tablet; Take 1 tablet (875 mg total) by mouth 2 (two) times daily for 10 days.  Dispense: 20 tablet; Refill: 0  Call if symptoms change or if not rapidly improving.         The entirety of the information documented in the History of Present Illness, Review of Systems and  Physical Exam were personally obtained by me. Portions of this information were initially documented by the CMA and reviewed by me for thoroughness and accuracy.     Mila Merry, MD  [facility] [phone] (phone) [phone] (fax)  [facility]

## 2020-12-04 ENCOUNTER — Other Ambulatory Visit: Payer: Self-pay

## 2020-12-04 ENCOUNTER — Encounter (INDEPENDENT_AMBULATORY_CARE_PROVIDER_SITE_OTHER): Payer: BC Managed Care – PPO | Admitting: Ophthalmology

## 2020-12-04 DIAGNOSIS — H35033 Hypertensive retinopathy, bilateral: Secondary | ICD-10-CM

## 2020-12-04 DIAGNOSIS — I10 Essential (primary) hypertension: Secondary | ICD-10-CM

## 2020-12-04 DIAGNOSIS — H43813 Vitreous degeneration, bilateral: Secondary | ICD-10-CM

## 2020-12-04 DIAGNOSIS — E113391 Type 2 diabetes mellitus with moderate nonproliferative diabetic retinopathy without macular edema, right eye: Secondary | ICD-10-CM

## 2020-12-04 DIAGNOSIS — E113592 Type 2 diabetes mellitus with proliferative diabetic retinopathy without macular edema, left eye: Secondary | ICD-10-CM

## 2021-01-22 ENCOUNTER — Ambulatory Visit: Payer: BC Managed Care – PPO

## 2021-01-29 DIAGNOSIS — E1142 Type 2 diabetes mellitus with diabetic polyneuropathy: Secondary | ICD-10-CM | POA: Diagnosis not present

## 2021-01-29 DIAGNOSIS — E785 Hyperlipidemia, unspecified: Secondary | ICD-10-CM | POA: Diagnosis not present

## 2021-01-29 DIAGNOSIS — E1169 Type 2 diabetes mellitus with other specified complication: Secondary | ICD-10-CM | POA: Diagnosis not present

## 2021-01-29 DIAGNOSIS — E1159 Type 2 diabetes mellitus with other circulatory complications: Secondary | ICD-10-CM | POA: Diagnosis not present

## 2021-01-29 DIAGNOSIS — Z794 Long term (current) use of insulin: Secondary | ICD-10-CM | POA: Diagnosis not present

## 2021-01-29 DIAGNOSIS — I152 Hypertension secondary to endocrine disorders: Secondary | ICD-10-CM | POA: Diagnosis not present

## 2021-02-03 DIAGNOSIS — E1142 Type 2 diabetes mellitus with diabetic polyneuropathy: Secondary | ICD-10-CM | POA: Diagnosis not present

## 2021-02-03 DIAGNOSIS — E1169 Type 2 diabetes mellitus with other specified complication: Secondary | ICD-10-CM | POA: Diagnosis not present

## 2021-02-03 DIAGNOSIS — Z794 Long term (current) use of insulin: Secondary | ICD-10-CM | POA: Diagnosis not present

## 2021-02-03 DIAGNOSIS — E1159 Type 2 diabetes mellitus with other circulatory complications: Secondary | ICD-10-CM | POA: Diagnosis not present

## 2021-03-19 ENCOUNTER — Other Ambulatory Visit: Payer: Self-pay

## 2021-03-19 ENCOUNTER — Encounter (INDEPENDENT_AMBULATORY_CARE_PROVIDER_SITE_OTHER): Payer: BC Managed Care – PPO | Admitting: Ophthalmology

## 2021-03-19 DIAGNOSIS — E103592 Type 1 diabetes mellitus with proliferative diabetic retinopathy without macular edema, left eye: Secondary | ICD-10-CM

## 2021-03-19 DIAGNOSIS — I10 Essential (primary) hypertension: Secondary | ICD-10-CM

## 2021-03-19 DIAGNOSIS — H43813 Vitreous degeneration, bilateral: Secondary | ICD-10-CM

## 2021-03-19 DIAGNOSIS — E103391 Type 1 diabetes mellitus with moderate nonproliferative diabetic retinopathy without macular edema, right eye: Secondary | ICD-10-CM

## 2021-03-19 DIAGNOSIS — H35033 Hypertensive retinopathy, bilateral: Secondary | ICD-10-CM

## 2021-03-25 DIAGNOSIS — H4061X1 Glaucoma secondary to drugs, right eye, mild stage: Secondary | ICD-10-CM | POA: Diagnosis not present

## 2021-03-25 DIAGNOSIS — E113311 Type 2 diabetes mellitus with moderate nonproliferative diabetic retinopathy with macular edema, right eye: Secondary | ICD-10-CM | POA: Diagnosis not present

## 2021-03-25 LAB — HM DIABETES EYE EXAM

## 2021-04-05 ENCOUNTER — Encounter: Payer: Self-pay | Admitting: Family Medicine

## 2021-04-05 DIAGNOSIS — H4060X0 Glaucoma secondary to drugs, unspecified eye, stage unspecified: Secondary | ICD-10-CM | POA: Insufficient documentation

## 2021-04-05 DIAGNOSIS — T380X5A Adverse effect of glucocorticoids and synthetic analogues, initial encounter: Secondary | ICD-10-CM | POA: Insufficient documentation

## 2021-07-23 ENCOUNTER — Encounter (INDEPENDENT_AMBULATORY_CARE_PROVIDER_SITE_OTHER): Payer: BC Managed Care – PPO | Admitting: Ophthalmology

## 2021-07-23 DIAGNOSIS — H35033 Hypertensive retinopathy, bilateral: Secondary | ICD-10-CM

## 2021-07-23 DIAGNOSIS — H43813 Vitreous degeneration, bilateral: Secondary | ICD-10-CM | POA: Diagnosis not present

## 2021-07-23 DIAGNOSIS — E103592 Type 1 diabetes mellitus with proliferative diabetic retinopathy without macular edema, left eye: Secondary | ICD-10-CM

## 2021-07-23 DIAGNOSIS — E103311 Type 1 diabetes mellitus with moderate nonproliferative diabetic retinopathy with macular edema, right eye: Secondary | ICD-10-CM | POA: Diagnosis not present

## 2021-07-23 DIAGNOSIS — I10 Essential (primary) hypertension: Secondary | ICD-10-CM | POA: Diagnosis not present

## 2021-07-30 DIAGNOSIS — Z794 Long term (current) use of insulin: Secondary | ICD-10-CM | POA: Diagnosis not present

## 2021-07-30 DIAGNOSIS — E1142 Type 2 diabetes mellitus with diabetic polyneuropathy: Secondary | ICD-10-CM | POA: Diagnosis not present

## 2021-07-30 LAB — PROTEIN / CREATININE RATIO, URINE: Creatinine, Urine: 111.8

## 2021-07-30 LAB — MICROALBUMIN, URINE: Microalb, Ur: <3

## 2021-07-30 LAB — MICROALBUMIN / CREATININE URINE RATIO: Microalb Creat Ratio: <3

## 2021-08-05 DIAGNOSIS — E1142 Type 2 diabetes mellitus with diabetic polyneuropathy: Secondary | ICD-10-CM | POA: Diagnosis not present

## 2021-08-05 DIAGNOSIS — Z794 Long term (current) use of insulin: Secondary | ICD-10-CM | POA: Diagnosis not present

## 2021-08-05 DIAGNOSIS — E1169 Type 2 diabetes mellitus with other specified complication: Secondary | ICD-10-CM | POA: Diagnosis not present

## 2021-08-05 DIAGNOSIS — E1159 Type 2 diabetes mellitus with other circulatory complications: Secondary | ICD-10-CM | POA: Diagnosis not present

## 2021-09-03 ENCOUNTER — Encounter (INDEPENDENT_AMBULATORY_CARE_PROVIDER_SITE_OTHER): Payer: BC Managed Care – PPO | Admitting: Ophthalmology

## 2021-09-03 DIAGNOSIS — I10 Essential (primary) hypertension: Secondary | ICD-10-CM | POA: Diagnosis not present

## 2021-09-03 DIAGNOSIS — E103592 Type 1 diabetes mellitus with proliferative diabetic retinopathy without macular edema, left eye: Secondary | ICD-10-CM | POA: Diagnosis not present

## 2021-09-03 DIAGNOSIS — E103311 Type 1 diabetes mellitus with moderate nonproliferative diabetic retinopathy with macular edema, right eye: Secondary | ICD-10-CM

## 2021-09-03 DIAGNOSIS — H35033 Hypertensive retinopathy, bilateral: Secondary | ICD-10-CM | POA: Diagnosis not present

## 2021-09-03 DIAGNOSIS — H43813 Vitreous degeneration, bilateral: Secondary | ICD-10-CM

## 2021-09-07 ENCOUNTER — Ambulatory Visit: Payer: BC Managed Care – PPO | Admitting: Gastroenterology

## 2021-09-24 DIAGNOSIS — H4061X1 Glaucoma secondary to drugs, right eye, mild stage: Secondary | ICD-10-CM | POA: Diagnosis not present

## 2021-10-15 ENCOUNTER — Encounter (INDEPENDENT_AMBULATORY_CARE_PROVIDER_SITE_OTHER): Payer: BC Managed Care – PPO | Admitting: Ophthalmology

## 2021-10-15 DIAGNOSIS — E103311 Type 1 diabetes mellitus with moderate nonproliferative diabetic retinopathy with macular edema, right eye: Secondary | ICD-10-CM | POA: Diagnosis not present

## 2021-10-15 DIAGNOSIS — H43813 Vitreous degeneration, bilateral: Secondary | ICD-10-CM

## 2021-10-15 DIAGNOSIS — E103592 Type 1 diabetes mellitus with proliferative diabetic retinopathy without macular edema, left eye: Secondary | ICD-10-CM | POA: Diagnosis not present

## 2021-10-15 DIAGNOSIS — I10 Essential (primary) hypertension: Secondary | ICD-10-CM | POA: Diagnosis not present

## 2021-10-15 DIAGNOSIS — H35033 Hypertensive retinopathy, bilateral: Secondary | ICD-10-CM

## 2021-11-25 ENCOUNTER — Ambulatory Visit: Payer: Self-pay

## 2021-11-25 NOTE — Patient Outreach (Signed)
  Care Coordination   11/25/2021 Name: Bright Spielmann MRN: [id number] DOB: [birth year]   Care Coordination Outreach Attempts:  An unsuccessful telephone outreach was attempted today to offer the patient information about available care coordination services as a benefit of their health plan.   Follow Up Plan:  Additional outreach attempts will be made to offer the patient care coordination information and services.   Encounter Outcome:  No Answer  Care Coordination Interventions Activated:  No   Care Coordination Interventions:  No, not indicated    .p

## 2021-12-03 ENCOUNTER — Encounter (INDEPENDENT_AMBULATORY_CARE_PROVIDER_SITE_OTHER): Payer: BC Managed Care – PPO | Admitting: Ophthalmology

## 2021-12-03 DIAGNOSIS — H43813 Vitreous degeneration, bilateral: Secondary | ICD-10-CM

## 2021-12-03 DIAGNOSIS — I10 Essential (primary) hypertension: Secondary | ICD-10-CM | POA: Diagnosis not present

## 2021-12-03 DIAGNOSIS — E103311 Type 1 diabetes mellitus with moderate nonproliferative diabetic retinopathy with macular edema, right eye: Secondary | ICD-10-CM | POA: Diagnosis not present

## 2021-12-03 DIAGNOSIS — E103592 Type 1 diabetes mellitus with proliferative diabetic retinopathy without macular edema, left eye: Secondary | ICD-10-CM | POA: Diagnosis not present

## 2021-12-03 DIAGNOSIS — H35033 Hypertensive retinopathy, bilateral: Secondary | ICD-10-CM

## 2021-12-10 ENCOUNTER — Encounter (INDEPENDENT_AMBULATORY_CARE_PROVIDER_SITE_OTHER): Payer: BC Managed Care – PPO | Admitting: Ophthalmology

## 2021-12-10 DIAGNOSIS — E113311 Type 2 diabetes mellitus with moderate nonproliferative diabetic retinopathy with macular edema, right eye: Secondary | ICD-10-CM | POA: Diagnosis not present

## 2022-01-21 ENCOUNTER — Encounter (INDEPENDENT_AMBULATORY_CARE_PROVIDER_SITE_OTHER): Payer: BC Managed Care – PPO | Admitting: Ophthalmology

## 2022-01-21 DIAGNOSIS — E113592 Type 2 diabetes mellitus with proliferative diabetic retinopathy without macular edema, left eye: Secondary | ICD-10-CM

## 2022-01-21 DIAGNOSIS — I10 Essential (primary) hypertension: Secondary | ICD-10-CM

## 2022-01-21 DIAGNOSIS — E113311 Type 2 diabetes mellitus with moderate nonproliferative diabetic retinopathy with macular edema, right eye: Secondary | ICD-10-CM | POA: Diagnosis not present

## 2022-01-21 DIAGNOSIS — H35033 Hypertensive retinopathy, bilateral: Secondary | ICD-10-CM

## 2022-01-21 DIAGNOSIS — H43813 Vitreous degeneration, bilateral: Secondary | ICD-10-CM | POA: Diagnosis not present

## 2022-02-03 DIAGNOSIS — Z794 Long term (current) use of insulin: Secondary | ICD-10-CM | POA: Diagnosis not present

## 2022-02-03 DIAGNOSIS — E785 Hyperlipidemia, unspecified: Secondary | ICD-10-CM | POA: Diagnosis not present

## 2022-02-03 DIAGNOSIS — E1169 Type 2 diabetes mellitus with other specified complication: Secondary | ICD-10-CM | POA: Diagnosis not present

## 2022-02-03 DIAGNOSIS — I152 Hypertension secondary to endocrine disorders: Secondary | ICD-10-CM | POA: Diagnosis not present

## 2022-02-03 DIAGNOSIS — E1142 Type 2 diabetes mellitus with diabetic polyneuropathy: Secondary | ICD-10-CM | POA: Diagnosis not present

## 2022-02-03 LAB — BASIC METABOLIC PANEL
BUN: 13 (ref 4–21)
CO2: 25 — AB (ref 13–22)
Chloride: 103 (ref 99–108)
Creatinine: 1 (ref 0.6–1.3)
Glucose: 119
Potassium: 4.3 mEq/L (ref 3.5–5.1)
Sodium: 141 (ref 137–147)

## 2022-02-03 LAB — COMPREHENSIVE METABOLIC PANEL
Albumin: 4.3 (ref 3.5–5.0)
Calcium: 9.2 (ref 8.7–10.7)
Globulin: 2.3
eGFR: 88

## 2022-02-03 LAB — HEPATIC FUNCTION PANEL
ALT: 19 U/L (ref 10–40)
AST: 19 (ref 14–40)
Alkaline Phosphatase: 65 (ref 25–125)
Bilirubin, Total: 1.1

## 2022-02-03 LAB — LIPID PANEL
Cholesterol: 149 (ref 0–200)
HDL: 52 (ref 35–70)
LDL Cholesterol: 86
Triglycerides: 50 (ref 40–160)

## 2022-02-03 LAB — HEMOGLOBIN A1C: Hemoglobin A1C: 6.9

## 2022-02-09 DIAGNOSIS — E1169 Type 2 diabetes mellitus with other specified complication: Secondary | ICD-10-CM | POA: Diagnosis not present

## 2022-02-09 DIAGNOSIS — E1142 Type 2 diabetes mellitus with diabetic polyneuropathy: Secondary | ICD-10-CM | POA: Diagnosis not present

## 2022-02-09 DIAGNOSIS — Z794 Long term (current) use of insulin: Secondary | ICD-10-CM | POA: Diagnosis not present

## 2022-02-09 DIAGNOSIS — E1159 Type 2 diabetes mellitus with other circulatory complications: Secondary | ICD-10-CM | POA: Diagnosis not present

## 2022-02-21 ENCOUNTER — Encounter: Payer: Self-pay | Admitting: Family Medicine

## 2022-02-21 ENCOUNTER — Ambulatory Visit
Admission: RE | Admit: 2022-02-21 | Discharge: 2022-02-21 | Disposition: A | Discharge status: Home / Self Care | Payer: BC Managed Care – PPO | Source: Ambulatory Visit | Attending: Family Medicine | Admitting: Family Medicine

## 2022-02-21 ENCOUNTER — Ambulatory Visit
Admission: RE | Admit: 2022-02-21 | Discharge: 2022-02-21 | Disposition: A | Discharge status: Home / Self Care | Payer: BC Managed Care – PPO | Attending: Family Medicine | Admitting: Family Medicine

## 2022-02-21 ENCOUNTER — Ambulatory Visit (INDEPENDENT_AMBULATORY_CARE_PROVIDER_SITE_OTHER): Payer: BC Managed Care – PPO | Admitting: Family Medicine

## 2022-02-21 VITALS — BP 94/77 | HR 67 | Temp 98.7°F | Wt 244.0 lb

## 2022-02-21 DIAGNOSIS — Z23 Encounter for immunization: Secondary | ICD-10-CM

## 2022-02-21 DIAGNOSIS — Z794 Long term (current) use of insulin: Secondary | ICD-10-CM | POA: Diagnosis not present

## 2022-02-21 DIAGNOSIS — R2242 Localized swelling, mass and lump, left lower limb: Secondary | ICD-10-CM | POA: Insufficient documentation

## 2022-02-21 DIAGNOSIS — Z125 Encounter for screening for malignant neoplasm of prostate: Secondary | ICD-10-CM | POA: Diagnosis not present

## 2022-02-21 DIAGNOSIS — Z0000 Encounter for general adult medical examination without abnormal findings: Secondary | ICD-10-CM

## 2022-02-21 DIAGNOSIS — I10 Essential (primary) hypertension: Secondary | ICD-10-CM

## 2022-02-21 DIAGNOSIS — E11311 Type 2 diabetes mellitus with unspecified diabetic retinopathy with macular edema: Secondary | ICD-10-CM

## 2022-02-21 DIAGNOSIS — M19072 Primary osteoarthritis, left ankle and foot: Secondary | ICD-10-CM | POA: Diagnosis not present

## 2022-02-21 NOTE — Progress Notes (Signed)
I,Jimmy Mills,acting as a scribe for Jimmy Merryonald Winfred Redel, MD.,have documented all relevant documentation on the behalf of Jimmy MerryDonald Gael Londo, MD,as directed by  Jimmy Merryonald Donne Baley, MD while in the presence of Jimmy Merryonald Abed Schar, MD.    Complete physical exam   Patient: Jimmy Mills   DOB: [birth year]   56 y.o. Male  MRN: [id number] Visit Date: 02/21/2022  Today's healthcare provider: Mila Merryonald Kyon Bentler, MD   Chief Complaint  Patient presents with   Annual Exam   Subjective    Jimmy FinchKenneth Mills is a 56 y.o. male who presents today for a complete physical exam.  He reports consuming a general diet.  He generally feels well. He reports sleeping well. He does have additional problems to discuss today.    Last TDAP 02/24/2011. Last Colonoscopy 07/19/2019.  He continues follow up with Dr. Gershon Mills'connell for diabetes and is very well controlled.   He also reports that he noticed a lump on the left side of his foot, no known injury. Is not painful.  Past Medical History:  Diagnosis Date   Diabetes mellitus without complication (HCC)    History of chicken pox 12/30/2014   DID have Chicken Pox. DID have Mumps.     Hypertension    Past Surgical History:  Procedure Laterality Date   BACK SURGERY     CATARACT EXTRACTION Left 06/2014   CERVICAL FUSION  11/10/2014   COLONOSCOPY WITH PROPOFOL N/A 07/19/2019   Procedure: COLONOSCOPY WITH PROPOFOL;  Surgeon: Jimmy Mills, Darren, MD;  [facility]COPY;  Service: Endoscopy;  Laterality: N/A;   SHOULDER ARTHROSCOPY Right    XI ROBOTIC LAPAROSCOPIC ASSISTED APPENDECTOMY N/A 02/19/2020   Procedure: XI ROBOTIC LAPAROSCOPIC ASSISTED APPENDECTOMY;  Surgeon: Sung AmabileSakai, Isami, DO;  Location: [facility] ORS;  Service: General;  Laterality: N/A;   Social History   Socioeconomic History   Marital status: Divorced    Spouse name: Not on file   Number of children: 2   Years of education: Not on file   Highest education level: Not on file  Occupational History    Employer:  [employer]    Comment: 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  Tobacco Use   Smoking status: Never   Smokeless tobacco: Never  Substance and Sexual Activity   Alcohol use: No   Drug use: No   Sexual activity: Yes  Other Topics Concern   Not on file  Social History Narrative   Not on file   Social Determinants of Health   Financial Resource Strain: Not on file  Food Insecurity: Not on file  Transportation Needs: Not on file  Physical Activity: Not on file  Stress: Not on file  Social Connections: Not on file  Intimate Partner Violence: Not on file   Family Status  Relation Name Status   Mother  Deceased   Father  Deceased   Sister  Alive   Daughter  Alive   Son  Alive   Sister  Alive   Family History  Problem Relation Age of Onset   Diabetes Mother    Glaucoma Mother    Liver cancer Mother    Pancreatic cancer Mother    Cancer Father        cancer of the prostate   Dementia Father    Allergies  Allergen Reactions   Doxycycline Rash    Patient Care Team: Jimmy Mills, Jimmy Krontz E, MD as PCP - General (Family Medicine) Jimmy Mills, Jimmy D, MD as Consulting Physician (Ophthalmology) Jimmy Mills, Jimmy L, MD as Consulting  Physician (Endocrinology) Jimmy Flow, MD as Consulting Physician (Ophthalmology)   Medications: Outpatient Medications Prior to Visit  Medication Sig   JARDIANCE 25 MG TABS tablet Take 25 mg by mouth daily.   amLODipine (NORVASC) 5 MG tablet Take 5 mg by mouth daily.   brimonidine (ALPHAGAN) 0.2 % ophthalmic solution Place 1 drop into the right eye 2 (two) times daily.   cholecalciferol (VITAMIN D3) 25 MCG (1000 UNIT) tablet Take 1,000 Units by mouth daily. (Patient not taking: Reported on 02/21/2022)   dorzolamide-timolol (COSOPT) 22.3-6.8 MG/ML ophthalmic solution Place 1 drop into the right eye 2 (two) times daily.   glimepiride (AMARYL) 2 MG tablet Take 2 mg by mouth daily as needed (high blood sugar).    HYDROcodone-acetaminophen (NORCO) 5-325 MG tablet  Take 1 tablet by mouth every 6 (six) hours as needed for up to 6 doses for moderate pain. (Patient not taking: Reported on 02/21/2022)   ibuprofen (ADVIL) 800 MG tablet Take 1 tablet (800 mg total) by mouth every 8 (eight) hours as needed for mild pain or moderate pain.   linaclotide (LINZESS) 145 MCG CAPS capsule Take 1 capsule (145 mcg total) by mouth daily before breakfast.   losartan (COZAAR) 100 MG tablet Take 100 mg by mouth daily.   metFORMIN (GLUCOPHAGE-XR) 500 MG 24 hr tablet Take 1,000 mg by mouth daily.  (Patient not taking: Reported on 02/21/2022)   Multiple Vitamins-Minerals (MULTIVITAMIN WITH MINERALS) tablet Take 1 tablet by mouth daily.   OZEMPIC, 1 MG/DOSE, 2 MG/1.5ML SOPN Inject 1 Dose as directed every Saturday.    rosuvastatin (CRESTOR) 5 MG tablet Take 5 mg by mouth every Monday, Wednesday, and Friday.    No facility-administered medications prior to visit.    Review of Systems  Gastrointestinal:  Positive for constipation.  All other systems reviewed and are negative.     Objective    BP 94/77 (BP Location: Right Arm, Patient Position: Sitting, Cuff Size: Normal)   Pulse 67   Temp 98.7 F (37.1 C) (Oral)   Wt 244 lb (110.7 kg)   SpO2 96% Comment: room air  BMI 29.70 kg/m     Physical Exam   General Appearance:    Well developed, well nourished male. Alert, cooperative, in no acute distress, appears stated age  Head:    Normocephalic, without obvious abnormality, atraumatic  Eyes:    PERRL, conjunctiva/corneas clear, EOM's intact, fundi    benign, both eyes       Ears:    Normal TM's and external ear canals, both ears  Nose:   Nares normal, septum midline, mucosa normal, no drainage   or sinus tenderness  Throat:   Lips, mucosa, and tongue normal; teeth and gums normal  Neck:   Supple, symmetrical, trachea midline, no adenopathy;       thyroid:  No enlargement/tenderness/nodules; no carotid   bruit or JVD  Back:     Symmetric, no curvature, ROM normal,  no CVA tenderness  Lungs:     Clear to auscultation bilaterally, respirations unlabored  Chest wall:    No tenderness or deformity  Heart:    Normal heart rate. Normal rhythm. No murmurs, rubs, or gallops.  S1 and S2 normal  Abdomen:     Soft, non-tender, bowel sounds active all four quadrants,    no masses, no organomegaly  Genitalia:    deferred  Rectal:    deferred  Extremities:   All extremities are intact. No cyanosis or edema. Bony nodule on dorso-lateral  aspect of left foot, non-tender. Not erythematous.   Pulses:   2+ and symmetric all extremities  Skin:   Skin color, texture, turgor normal, no rashes or lesions  Lymph nodes:   Cervical, supraclavicular, and axillary nodes normal  Neurologic:   CNII-XII intact. Normal strength, sensation and reflexes      throughout     Last depression screening scores    07/03/2020    1:53 PM 02/25/2019    2:50 PM 02/25/2019    2:49 PM  PHQ 2/9 Scores  PHQ - 2 Score 0 0 0  PHQ- 9 Score 0 0    Last fall risk screening    07/03/2020    1:54 PM  Fall Risk   Falls in the past year? 0  Number falls in past yr: 0  Injury with Fall? 0  Follow up Falls evaluation completed   Last Audit-C alcohol use screening    07/03/2020    1:54 PM  Alcohol Use Disorder Test (AUDIT)  1. How often do you have a drink containing alcohol? 0  2. How many drinks containing alcohol do you have on a typical day when you are drinking? 0  3. How often do you have six or more drinks on one occasion? 0  AUDIT-C Score 0   A score of 3 or more in women, and 4 or more in men indicates increased risk for alcohol abuse, EXCEPT if all of the points are from question 1   No results found for any visits on 02/21/22.  Assessment & Plan    Routine Health Maintenance and Physical Exam  Exercise Activities and Dietary recommendations  Goals   None     Immunization History  Administered Date(s) Administered   Astrazeneca Covid-19 Vaccine, Pf, 0.5 Ml Non [place]  01/05/2022   Influenza Inj Mdck Quad Pf 02/09/2022   Influenza,inj,Quad PF,6+ Mos 11/24/2016, 11/28/2017, 12/05/2018   Influenza-Unspecified 12/27/2014   PFIZER Comirnaty(Gray Top)Covid-19 Tri-Sucrose Vaccine 07/15/2020   PFIZER(Purple Top)SARS-COV-2 Vaccination 05/27/2019, 06/17/2019, 12/21/2019   Pneumococcal Polysaccharide-23 02/24/2011   Tdap 02/24/2011   Zoster Recombinat (Shingrix) 11/28/2017, 01/29/2018    Health Maintenance  Topic Date Due   COVID-19 Vaccine (5 - 2023-24 season) 12/27/2022 (Originally 11/26/2021)   OPHTHALMOLOGY EXAM  03/25/2022   Diabetic kidney evaluation - Urine ACR  07/31/2022   HEMOGLOBIN A1C  08/04/2022   Diabetic kidney evaluation - GFR measurement  02/04/2023   COLONOSCOPY (Pts 45-57yrs Insurance coverage will need to be confirmed)  07/18/2029   INFLUENZA VACCINE  Completed   Hepatitis C Screening  Completed   HIV Screening  Completed   Zoster Vaccines- Shingrix  Completed   HPV VACCINES  Aged Out    Discussed health benefits of physical activity, and encouraged him to engage in regular exercise appropriate for his age and condition.  2. 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 Prostate cancer screening  - PSA Total (Reflex To Free) ([facility] only)  4. Need for prophylactic vaccination using tetanus and diphtheria toxoids adsorbed (Tdap) vaccine Tdap given today  5. Essential hypertension Very well controlled. Is hypotensive today. He states that Dr. Gershon Mills recommended he stop amlodipine. I recommend he could probably do so safely, or at least cut to 1/2 tablet a day.   6. Mass of left foot  -  DG Foot Complete Left; Future      The entirety of the information documented in the History of Present Illness,  Review of Systems and Physical Exam were personally obtained by me. Portions of this information were initially documented by the CMA and reviewed by me for thoroughness and accuracy.     Jimmy Merry, MD  [facility] [phone] (phone) [phone] (fax)  [facility]

## 2022-02-22 LAB — PSA TOTAL (REFLEX TO FREE): Prostate Specific Ag, Serum: 0.5 ng/mL (ref 0.0–4.0)

## 2022-03-02 ENCOUNTER — Telehealth: Payer: Self-pay

## 2022-03-02 DIAGNOSIS — M79673 Pain in unspecified foot: Secondary | ICD-10-CM

## 2022-03-02 NOTE — Telephone Encounter (Signed)
Copied from CRM [id number]. Topic: General - Inquiry >> Mar 02, 2022  3:32 PM Marlow Baars wrote: Reason for CRM: The patient called in inquiring about his lab results as well as his foot x ray. Please assist patient as soon as soon as possible.

## 2022-03-03 NOTE — Telephone Encounter (Signed)
Patient was given the results to labs and x-rays. Patient would to be referred to either Podiatry or Ortho for foot pain. Please advise referral?

## 2022-03-03 NOTE — Telephone Encounter (Signed)
Have sent referral order to podiatry

## 2022-03-04 ENCOUNTER — Encounter (INDEPENDENT_AMBULATORY_CARE_PROVIDER_SITE_OTHER): Payer: BC Managed Care – PPO | Admitting: Ophthalmology

## 2022-03-04 DIAGNOSIS — E113311 Type 2 diabetes mellitus with moderate nonproliferative diabetic retinopathy with macular edema, right eye: Secondary | ICD-10-CM | POA: Diagnosis not present

## 2022-03-04 DIAGNOSIS — H43813 Vitreous degeneration, bilateral: Secondary | ICD-10-CM

## 2022-03-04 DIAGNOSIS — H35033 Hypertensive retinopathy, bilateral: Secondary | ICD-10-CM

## 2022-03-04 DIAGNOSIS — E113592 Type 2 diabetes mellitus with proliferative diabetic retinopathy without macular edema, left eye: Secondary | ICD-10-CM | POA: Diagnosis not present

## 2022-03-04 DIAGNOSIS — I10 Essential (primary) hypertension: Secondary | ICD-10-CM | POA: Diagnosis not present

## 2022-03-09 ENCOUNTER — Ambulatory Visit: Payer: BC Managed Care – PPO | Admitting: Podiatry

## 2022-03-09 DIAGNOSIS — M67472 Ganglion, left ankle and foot: Secondary | ICD-10-CM

## 2022-03-10 NOTE — Progress Notes (Signed)
  Subjective:  Patient ID: Vaden Becherer, male    DOB: 1965/11/25,  MRN: [id number]  Chief Complaint  Patient presents with   Arthritis    np bump on left foot by ankle causing pain/ pt states xray was done and he was told it was arthritis - has xrays on 02/12/22   Diabetes    A1C  6.9, diabetic foot care    56 y.o. male presents with the above complaint. History confirmed with patient.   Objective:  Physical Exam: warm, good capillary refill, no trophic changes or ulcerative lesions, normal DP and PT pulses, normal sensory exam, and palpable soft mobile cyst present on the dorsal fifth metatarsal base   Radiographs: Multiple views x-ray of the left foot:  He has mild midfoot degenerative changes but no fracture or dislocation in the area of concern Assessment:   1. Ganglion cyst of left foot      Plan:  Patient was evaluated and treated and all questions answered.  Discussed etiology and treatment options of ganglion cyst.  Discussed treatment with aspiration and injection as well as surgical excision.  Currently it is minimally painful for him.  He will return to see me as needed if it becomes more bothersome.  No follow-ups on file.

## 2022-04-01 ENCOUNTER — Encounter (INDEPENDENT_AMBULATORY_CARE_PROVIDER_SITE_OTHER): Payer: BC Managed Care – PPO | Admitting: Ophthalmology

## 2022-04-01 DIAGNOSIS — H35033 Hypertensive retinopathy, bilateral: Secondary | ICD-10-CM

## 2022-04-01 DIAGNOSIS — E113512 Type 2 diabetes mellitus with proliferative diabetic retinopathy with macular edema, left eye: Secondary | ICD-10-CM

## 2022-04-01 DIAGNOSIS — I10 Essential (primary) hypertension: Secondary | ICD-10-CM

## 2022-04-01 DIAGNOSIS — E113391 Type 2 diabetes mellitus with moderate nonproliferative diabetic retinopathy without macular edema, right eye: Secondary | ICD-10-CM

## 2022-04-01 DIAGNOSIS — H43813 Vitreous degeneration, bilateral: Secondary | ICD-10-CM

## 2022-04-21 ENCOUNTER — Telehealth: Payer: Self-pay | Admitting: *Deleted

## 2022-04-21 NOTE — Progress Notes (Signed)
  Care Coordination   Note   04/21/2022 Name: Jimmy Mills MRN: [id number] DOB: February 28, 1966  Jimmy Mills is a 57 y.o. year old male who sees Fisher, Kirstie Peri, MD for primary care. I reached out to Jenean Lindau by phone today to offer care coordination services.  [name] was given information about Care Coordination services today including:   The Care Coordination services include support from the care team which includes your Nurse Coordinator, Clinical Social Worker, or Pharmacist.  The Care Coordination team is here to help remove barriers to the health concerns and goals most important to you. Care Coordination services are voluntary, and the patient may decline or stop services at any time by request to their care team member.   Care Coordination Consent Status: Patient agreed to services and verbal consent obtained.   Follow up plan:  Telephone appointment with care coordination team member scheduled for:  04/25/2022  Encounter Outcome:  Pt. Scheduled  Julian Hy, [place] Direct Dial: [phone]

## 2022-04-21 NOTE — Progress Notes (Signed)
  Care Coordination  Outreach Note  04/21/2022 Name: Jimmy Mills MRN: [id number] DOB: Jun 24, 1965   Care Coordination Outreach Attempts: An unsuccessful telephone outreach was attempted today to offer the patient information about available care coordination services as a benefit of their health plan.   Follow Up Plan:  Additional outreach attempts will be made to offer the patient care coordination information and services.   Encounter Outcome:  No Answer  Julian Hy, [place] Direct Dial: [phone]

## 2022-04-25 ENCOUNTER — Ambulatory Visit: Payer: Self-pay | Admitting: *Deleted

## 2022-04-25 NOTE — Patient Outreach (Signed)
  Care Coordination   04/25/2022 Name: Jimmy Mills MRN: [id number] DOB: [birth year]   Care Coordination Outreach Attempts:  An unsuccessful telephone outreach was attempted for a scheduled appointment today.  Follow Up Plan:  Additional outreach attempts will be made to offer the patient care coordination information and services.   Encounter Outcome:  No Answer   Care Coordination Interventions:  No, not indicated    Valente David, RN, MSN, [facility] Care Management Care Management Coordinator [phone]

## 2022-04-29 ENCOUNTER — Encounter (INDEPENDENT_AMBULATORY_CARE_PROVIDER_SITE_OTHER): Payer: BC Managed Care – PPO | Admitting: Ophthalmology

## 2022-04-29 DIAGNOSIS — E113592 Type 2 diabetes mellitus with proliferative diabetic retinopathy without macular edema, left eye: Secondary | ICD-10-CM

## 2022-04-29 DIAGNOSIS — H35033 Hypertensive retinopathy, bilateral: Secondary | ICD-10-CM

## 2022-04-29 DIAGNOSIS — I10 Essential (primary) hypertension: Secondary | ICD-10-CM

## 2022-04-29 DIAGNOSIS — E113391 Type 2 diabetes mellitus with moderate nonproliferative diabetic retinopathy without macular edema, right eye: Secondary | ICD-10-CM | POA: Diagnosis not present

## 2022-04-29 DIAGNOSIS — H43813 Vitreous degeneration, bilateral: Secondary | ICD-10-CM

## 2022-05-06 ENCOUNTER — Ambulatory Visit: Payer: Self-pay | Admitting: *Deleted

## 2022-05-06 ENCOUNTER — Encounter: Payer: Self-pay | Admitting: *Deleted

## 2022-05-06 NOTE — Patient Instructions (Signed)
Visit Information  Thank you for taking time to visit with me today. Please don't hesitate to contact me if I can be of assistance to you.  Following are the goals we discussed today:  Continue to monitor blood sugars and take medications as instructed.  Continue diet and exercise to manage diabetes.  Please call the Suicide and Crisis Lifeline: 988 call the [place] National Suicide Prevention Lifeline: (361) 522-7926 or TTY: 762-369-0939 TTY 602-108-3404) to talk to a trained counselor call 1-800-273-TALK (toll free, 24 hour hotline) call 911 if you are experiencing a Mental Health or [place] or need someone to talk to.  Patient verbalizes understanding of instructions and care plan provided today and agrees to view in [place]. Active MyChart status and patient understanding of how to access instructions and care plan via MyChart confirmed with patient.     The patient has been provided with contact information for the care management team and has been advised to call with any health related questions or concerns.   Valente David, RN, MSN, [place] Care Management Care Management Coordinator [phone]

## 2022-05-06 NOTE — Patient Outreach (Signed)
  Care Coordination   Initial Visit Note   05/06/2022 Name: Carron Jaggi MRN: [id number] DOB: 1965/04/26  Hawkins Seaman is a 57 y.o. year old male who sees Fisher, Kirstie Peri, MD for primary care. I spoke with  Jenean Lindau by phone today.  What matters to the patients health and wellness today?  Staying healthy and functional.  Denies any urgent concerns, encouraged to contact this care manager with questions.     Goals Addressed             This Visit's Progress    COMPLETED: Care Coordination Activities - No follow up needed       Care Coordination Interventions: Provided education to patient about basic DM disease process Reviewed medications with patient and discussed importance of medication adherence Discussed plans with patient for ongoing care management follow up and provided patient with direct contact information for care management team Reviewed scheduled/upcoming provider appointments including: Retina specialist on 3/1, endocrinology on 5/23.  Last PCP on 11/27, state he sees PCP yearly for physical, will call to schedule later this year Screening for signs and symptoms of depression related to chronic disease state  Assessed social determinant of health barriers Discussed need for routine eye and foot exams.  He is consistent with these follow ups. Discussed A1C goal of less than 7, currently 6.9         SDOH assessments and interventions completed:  Yes  SDOH Interventions Today    Flowsheet Row Most Recent Value  SDOH Interventions   Food Insecurity Interventions Intervention Not Indicated  Housing Interventions Intervention Not Indicated  Transportation Interventions Intervention Not Indicated        Care Coordination Interventions:  Yes, provided   Follow up plan: No further intervention required.   Encounter Outcome:  Pt. Visit Completed   Valente David, RN, MSN, [place] Care Management Care Management Coordinator [phone]

## 2022-05-11 DIAGNOSIS — H4061X1 Glaucoma secondary to drugs, right eye, mild stage: Secondary | ICD-10-CM | POA: Diagnosis not present

## 2022-05-27 ENCOUNTER — Encounter (INDEPENDENT_AMBULATORY_CARE_PROVIDER_SITE_OTHER): Payer: BC Managed Care – PPO | Admitting: Ophthalmology

## 2022-05-27 DIAGNOSIS — E103592 Type 1 diabetes mellitus with proliferative diabetic retinopathy without macular edema, left eye: Secondary | ICD-10-CM | POA: Diagnosis not present

## 2022-05-27 DIAGNOSIS — I10 Essential (primary) hypertension: Secondary | ICD-10-CM | POA: Diagnosis not present

## 2022-05-27 DIAGNOSIS — H35033 Hypertensive retinopathy, bilateral: Secondary | ICD-10-CM | POA: Diagnosis not present

## 2022-05-27 DIAGNOSIS — E103311 Type 1 diabetes mellitus with moderate nonproliferative diabetic retinopathy with macular edema, right eye: Secondary | ICD-10-CM | POA: Diagnosis not present

## 2022-05-27 DIAGNOSIS — H43813 Vitreous degeneration, bilateral: Secondary | ICD-10-CM

## 2022-06-24 ENCOUNTER — Encounter (INDEPENDENT_AMBULATORY_CARE_PROVIDER_SITE_OTHER): Payer: BC Managed Care – PPO | Admitting: Ophthalmology

## 2022-06-24 DIAGNOSIS — E113391 Type 2 diabetes mellitus with moderate nonproliferative diabetic retinopathy without macular edema, right eye: Secondary | ICD-10-CM | POA: Diagnosis not present

## 2022-06-24 DIAGNOSIS — E113592 Type 2 diabetes mellitus with proliferative diabetic retinopathy without macular edema, left eye: Secondary | ICD-10-CM | POA: Diagnosis not present

## 2022-06-24 DIAGNOSIS — I10 Essential (primary) hypertension: Secondary | ICD-10-CM | POA: Diagnosis not present

## 2022-06-24 DIAGNOSIS — H43813 Vitreous degeneration, bilateral: Secondary | ICD-10-CM

## 2022-06-24 DIAGNOSIS — H35033 Hypertensive retinopathy, bilateral: Secondary | ICD-10-CM | POA: Diagnosis not present

## 2022-07-22 ENCOUNTER — Encounter (INDEPENDENT_AMBULATORY_CARE_PROVIDER_SITE_OTHER): Payer: BC Managed Care – PPO | Admitting: Ophthalmology

## 2022-07-22 DIAGNOSIS — H35033 Hypertensive retinopathy, bilateral: Secondary | ICD-10-CM | POA: Diagnosis not present

## 2022-07-22 DIAGNOSIS — H43813 Vitreous degeneration, bilateral: Secondary | ICD-10-CM

## 2022-07-22 DIAGNOSIS — E113391 Type 2 diabetes mellitus with moderate nonproliferative diabetic retinopathy without macular edema, right eye: Secondary | ICD-10-CM | POA: Diagnosis not present

## 2022-07-22 DIAGNOSIS — E113592 Type 2 diabetes mellitus with proliferative diabetic retinopathy without macular edema, left eye: Secondary | ICD-10-CM

## 2022-07-22 DIAGNOSIS — Z7985 Long-term (current) use of injectable non-insulin antidiabetic drugs: Secondary | ICD-10-CM

## 2022-07-22 DIAGNOSIS — I10 Essential (primary) hypertension: Secondary | ICD-10-CM | POA: Diagnosis not present

## 2022-08-03 DIAGNOSIS — E1142 Type 2 diabetes mellitus with diabetic polyneuropathy: Secondary | ICD-10-CM | POA: Diagnosis not present

## 2022-08-03 DIAGNOSIS — E1159 Type 2 diabetes mellitus with other circulatory complications: Secondary | ICD-10-CM | POA: Diagnosis not present

## 2022-08-03 DIAGNOSIS — I152 Hypertension secondary to endocrine disorders: Secondary | ICD-10-CM | POA: Diagnosis not present

## 2022-08-03 DIAGNOSIS — Z794 Long term (current) use of insulin: Secondary | ICD-10-CM | POA: Diagnosis not present

## 2022-08-03 DIAGNOSIS — Z79899 Other long term (current) drug therapy: Secondary | ICD-10-CM | POA: Diagnosis not present

## 2022-08-18 DIAGNOSIS — E1159 Type 2 diabetes mellitus with other circulatory complications: Secondary | ICD-10-CM | POA: Diagnosis not present

## 2022-08-18 DIAGNOSIS — E1169 Type 2 diabetes mellitus with other specified complication: Secondary | ICD-10-CM | POA: Diagnosis not present

## 2022-08-18 DIAGNOSIS — Z794 Long term (current) use of insulin: Secondary | ICD-10-CM | POA: Diagnosis not present

## 2022-08-18 DIAGNOSIS — E1142 Type 2 diabetes mellitus with diabetic polyneuropathy: Secondary | ICD-10-CM | POA: Diagnosis not present

## 2022-08-19 DIAGNOSIS — E1142 Type 2 diabetes mellitus with diabetic polyneuropathy: Secondary | ICD-10-CM | POA: Diagnosis not present

## 2022-08-19 DIAGNOSIS — Z794 Long term (current) use of insulin: Secondary | ICD-10-CM | POA: Diagnosis not present

## 2022-08-26 ENCOUNTER — Encounter (INDEPENDENT_AMBULATORY_CARE_PROVIDER_SITE_OTHER): Payer: BC Managed Care – PPO | Admitting: Ophthalmology

## 2022-08-26 DIAGNOSIS — E113391 Type 2 diabetes mellitus with moderate nonproliferative diabetic retinopathy without macular edema, right eye: Secondary | ICD-10-CM | POA: Diagnosis not present

## 2022-08-26 DIAGNOSIS — E113592 Type 2 diabetes mellitus with proliferative diabetic retinopathy without macular edema, left eye: Secondary | ICD-10-CM | POA: Diagnosis not present

## 2022-08-26 DIAGNOSIS — I10 Essential (primary) hypertension: Secondary | ICD-10-CM

## 2022-08-26 DIAGNOSIS — H35033 Hypertensive retinopathy, bilateral: Secondary | ICD-10-CM | POA: Diagnosis not present

## 2022-08-26 DIAGNOSIS — H43813 Vitreous degeneration, bilateral: Secondary | ICD-10-CM

## 2022-10-10 ENCOUNTER — Encounter (INDEPENDENT_AMBULATORY_CARE_PROVIDER_SITE_OTHER): Payer: BC Managed Care – PPO | Admitting: Ophthalmology

## 2022-10-10 DIAGNOSIS — E113592 Type 2 diabetes mellitus with proliferative diabetic retinopathy without macular edema, left eye: Secondary | ICD-10-CM

## 2022-10-10 DIAGNOSIS — H43813 Vitreous degeneration, bilateral: Secondary | ICD-10-CM

## 2022-10-10 DIAGNOSIS — E113311 Type 2 diabetes mellitus with moderate nonproliferative diabetic retinopathy with macular edema, right eye: Secondary | ICD-10-CM | POA: Diagnosis not present

## 2022-10-10 DIAGNOSIS — H35033 Hypertensive retinopathy, bilateral: Secondary | ICD-10-CM

## 2022-10-10 DIAGNOSIS — Z7985 Long-term (current) use of injectable non-insulin antidiabetic drugs: Secondary | ICD-10-CM

## 2022-10-10 DIAGNOSIS — I10 Essential (primary) hypertension: Secondary | ICD-10-CM | POA: Diagnosis not present

## 2022-11-17 DIAGNOSIS — H4063X1 Glaucoma secondary to drugs, bilateral, mild stage: Secondary | ICD-10-CM | POA: Diagnosis not present

## 2022-11-17 DIAGNOSIS — E113211 Type 2 diabetes mellitus with mild nonproliferative diabetic retinopathy with macular edema, right eye: Secondary | ICD-10-CM | POA: Diagnosis not present

## 2022-11-17 DIAGNOSIS — H524 Presbyopia: Secondary | ICD-10-CM | POA: Diagnosis not present

## 2022-11-17 DIAGNOSIS — H35033 Hypertensive retinopathy, bilateral: Secondary | ICD-10-CM | POA: Diagnosis not present

## 2022-11-17 DIAGNOSIS — E113592 Type 2 diabetes mellitus with proliferative diabetic retinopathy without macular edema, left eye: Secondary | ICD-10-CM | POA: Diagnosis not present

## 2022-11-17 LAB — HM DIABETES EYE EXAM

## 2022-11-23 ENCOUNTER — Encounter (INDEPENDENT_AMBULATORY_CARE_PROVIDER_SITE_OTHER): Payer: BC Managed Care – PPO | Admitting: Ophthalmology

## 2022-11-23 DIAGNOSIS — E113592 Type 2 diabetes mellitus with proliferative diabetic retinopathy without macular edema, left eye: Secondary | ICD-10-CM

## 2022-11-23 DIAGNOSIS — I10 Essential (primary) hypertension: Secondary | ICD-10-CM | POA: Diagnosis not present

## 2022-11-23 DIAGNOSIS — E113311 Type 2 diabetes mellitus with moderate nonproliferative diabetic retinopathy with macular edema, right eye: Secondary | ICD-10-CM | POA: Diagnosis not present

## 2022-11-23 DIAGNOSIS — H35033 Hypertensive retinopathy, bilateral: Secondary | ICD-10-CM

## 2022-11-23 DIAGNOSIS — H43813 Vitreous degeneration, bilateral: Secondary | ICD-10-CM

## 2022-12-13 ENCOUNTER — Ambulatory Visit: Payer: BC Managed Care – PPO | Admitting: Pediatrics

## 2022-12-14 ENCOUNTER — Encounter: Payer: Self-pay | Admitting: Family Medicine

## 2022-12-14 ENCOUNTER — Ambulatory Visit: Payer: BC Managed Care – PPO | Admitting: Family Medicine

## 2022-12-14 VITALS — BP 88/56 | HR 88 | Ht 77.0 in | Wt 255.2 lb

## 2022-12-14 DIAGNOSIS — E1159 Type 2 diabetes mellitus with other circulatory complications: Secondary | ICD-10-CM

## 2022-12-14 DIAGNOSIS — E785 Hyperlipidemia, unspecified: Secondary | ICD-10-CM

## 2022-12-14 DIAGNOSIS — E1169 Type 2 diabetes mellitus with other specified complication: Secondary | ICD-10-CM | POA: Diagnosis not present

## 2022-12-14 DIAGNOSIS — H6121 Impacted cerumen, right ear: Secondary | ICD-10-CM | POA: Diagnosis not present

## 2022-12-14 DIAGNOSIS — H66001 Acute suppurative otitis media without spontaneous rupture of ear drum, right ear: Secondary | ICD-10-CM | POA: Diagnosis not present

## 2022-12-14 DIAGNOSIS — I152 Hypertension secondary to endocrine disorders: Secondary | ICD-10-CM

## 2022-12-14 DIAGNOSIS — Z7984 Long term (current) use of oral hypoglycemic drugs: Secondary | ICD-10-CM

## 2022-12-14 MED ORDER — CIPROFLOXACIN-DEXAMETHASONE 0.3-0.1 % OT SUSP: 4.0000 [drp] | Freq: Two times a day (BID) | OTIC | 0 refills | Status: AC

## 2022-12-14 NOTE — Progress Notes (Unsigned)
Established patient visit   Patient: Jimmy Mills   DOB: April 18, 1965   57 y.o. Male  MRN: [id number] Visit Date: 12/14/2022  Today's healthcare provider: Jacky Kindle, FNP  Introduced to nurse practitioner role and practice setting.  All questions answered.  Discussed provider/patient relationship and expectations.  Subjective    Ear Fullness    HPI   Patient reports pain in right ear and can not hear out of right ear X 1 week. Patient reports symptoms have remained the same. Patient would like to make sure no ear infection before traveling. Patient reports he has machine the squirts high pressure water into ear but that has not helped. Last used Thursday or Friday  Last edited by Acey Lav, CMA on 12/14/2022  1:58 PM.      Medications: Outpatient Medications Prior to Visit  Medication Sig   amLODipine (NORVASC) 5 MG tablet Take 5 mg by mouth daily.   brimonidine (ALPHAGAN) 0.2 % ophthalmic solution Place 1 drop into the right eye 2 (two) times daily.   dorzolamide-timolol (COSOPT) 22.3-6.8 MG/ML ophthalmic solution Place 1 drop into the right eye 2 (two) times daily.   glimepiride (AMARYL) 2 MG tablet Take 2 mg by mouth daily as needed (high blood sugar).    ibuprofen (ADVIL) 800 MG tablet Take 1 tablet (800 mg total) by mouth every 8 (eight) hours as needed for mild pain or moderate pain.   linaclotide (LINZESS) 145 MCG CAPS capsule Take 1 capsule (145 mcg total) by mouth daily before breakfast.   losartan (COZAAR) 100 MG tablet Take 100 mg by mouth daily.   Multiple Vitamins-Minerals (MULTIVITAMIN WITH MINERALS) tablet Take 1 tablet by mouth daily.   OZEMPIC, 1 MG/DOSE, 2 MG/1.5ML SOPN Inject 1 Dose as directed every Saturday.    rosuvastatin (CRESTOR) 5 MG tablet Take 5 mg by mouth every Monday, Wednesday, and Friday.    No facility-administered medications prior to visit.    Review of Systems      Objective    BP (!) 88/56 (BP Location: Left Arm, Patient  Position: Sitting, Cuff Size: Large)   Pulse 88   Ht 6\' 5"  (1.956 m)   Wt 255 lb 3.2 oz (115.8 kg) Comment: items in pockets  BMI 30.26 kg/m   Physical Exam Vitals and nursing note reviewed.  Constitutional:      General: He is not in acute distress.    Appearance: Normal appearance. He is obese. He is not ill-appearing, toxic-appearing or diaphoretic.  HENT:     Head: Normocephalic and atraumatic.     Right Ear: Ear canal and external ear normal. There is impacted cerumen. Tympanic membrane is erythematous.     Left Ear: Ear canal and external ear normal.     Ears:     Comments: R ear irrigated and impacted wax removed; following, TM is red and inflamed.     Nose: Nose normal.     Mouth/Throat:     Mouth: Mucous membranes are moist.     Pharynx: Oropharynx is clear.  Eyes:     Extraocular Movements: Extraocular movements intact.     Conjunctiva/sclera: Conjunctivae normal.  Cardiovascular:     Rate and Rhythm: Normal rate and regular rhythm.     Pulses: Normal pulses.     Heart sounds: Normal heart sounds.  Pulmonary:     Effort: Pulmonary effort is normal.     Breath sounds: Normal breath sounds.  Musculoskeletal:  General: Normal range of motion.     Cervical back: Normal range of motion.  Skin:    General: Skin is warm and dry.     Capillary Refill: Capillary refill takes less than 2 seconds.  Neurological:     General: No focal deficit present.     Mental Status: He is alert and oriented to person, place, and time. Mental status is at baseline.     No results found for any visits on 12/14/22.  Assessment & Plan     Problem List Items Addressed This Visit       Nervous and Auditory   Impacted cerumen of right ear - Primary    Acute self limiting; site irrigated Following removal of hard wax ball; inflammation and tenderness present on TM Recommend treatment as AOM right with topical in place of systemic treatment Continue to monitor Discussed use of  nasal steroids to assist with possible eustachian tube dysfunction       Other Visit Diagnoses     Hypertension associated with diabetes (HCC)       Hyperlipidemia associated with type 2 diabetes mellitus (HCC)       Non-recurrent acute suppurative otitis media of right ear without spontaneous rupture of tympanic membrane       Relevant Medications   ciprofloxacin-dexamethasone (CIPRODEX) OTIC suspension      Return if symptoms worsen or fail to improve.     Leilani Merl, FNP, have reviewed all documentation for this visit. The documentation on 12/15/22 for the exam, diagnosis, procedures, and orders are all accurate and complete.  Jacky Kindle, FNP  [facility] [phone] (phone) [phone] (fax)  [facility]

## 2022-12-15 ENCOUNTER — Encounter: Payer: Self-pay | Admitting: Family Medicine

## 2022-12-15 DIAGNOSIS — H6121 Impacted cerumen, right ear: Secondary | ICD-10-CM | POA: Insufficient documentation

## 2022-12-15 NOTE — Assessment & Plan Note (Signed)
Acute self limiting; site irrigated Following removal of hard wax ball; inflammation and tenderness present on TM Recommend treatment as AOM right with topical in place of systemic treatment Continue to monitor Discussed use of nasal steroids to assist with possible eustachian tube dysfunction

## 2022-12-30 ENCOUNTER — Encounter (INDEPENDENT_AMBULATORY_CARE_PROVIDER_SITE_OTHER): Payer: BC Managed Care – PPO | Admitting: Ophthalmology

## 2022-12-30 DIAGNOSIS — E113311 Type 2 diabetes mellitus with moderate nonproliferative diabetic retinopathy with macular edema, right eye: Secondary | ICD-10-CM

## 2022-12-30 DIAGNOSIS — E113592 Type 2 diabetes mellitus with proliferative diabetic retinopathy without macular edema, left eye: Secondary | ICD-10-CM | POA: Diagnosis not present

## 2022-12-30 DIAGNOSIS — Z7985 Long-term (current) use of injectable non-insulin antidiabetic drugs: Secondary | ICD-10-CM | POA: Diagnosis not present

## 2022-12-30 DIAGNOSIS — H43813 Vitreous degeneration, bilateral: Secondary | ICD-10-CM

## 2022-12-30 DIAGNOSIS — I10 Essential (primary) hypertension: Secondary | ICD-10-CM

## 2022-12-30 DIAGNOSIS — H35033 Hypertensive retinopathy, bilateral: Secondary | ICD-10-CM

## 2023-02-10 ENCOUNTER — Encounter (INDEPENDENT_AMBULATORY_CARE_PROVIDER_SITE_OTHER): Payer: BC Managed Care – PPO | Admitting: Ophthalmology

## 2023-02-10 DIAGNOSIS — Z7985 Long-term (current) use of injectable non-insulin antidiabetic drugs: Secondary | ICD-10-CM

## 2023-02-10 DIAGNOSIS — H43813 Vitreous degeneration, bilateral: Secondary | ICD-10-CM

## 2023-02-10 DIAGNOSIS — E113592 Type 2 diabetes mellitus with proliferative diabetic retinopathy without macular edema, left eye: Secondary | ICD-10-CM

## 2023-02-10 DIAGNOSIS — E113311 Type 2 diabetes mellitus with moderate nonproliferative diabetic retinopathy with macular edema, right eye: Secondary | ICD-10-CM | POA: Diagnosis not present

## 2023-02-10 DIAGNOSIS — I10 Essential (primary) hypertension: Secondary | ICD-10-CM | POA: Diagnosis not present

## 2023-02-10 DIAGNOSIS — H35033 Hypertensive retinopathy, bilateral: Secondary | ICD-10-CM

## 2023-02-15 DIAGNOSIS — E1159 Type 2 diabetes mellitus with other circulatory complications: Secondary | ICD-10-CM | POA: Diagnosis not present

## 2023-02-15 DIAGNOSIS — I152 Hypertension secondary to endocrine disorders: Secondary | ICD-10-CM | POA: Diagnosis not present

## 2023-02-15 DIAGNOSIS — E785 Hyperlipidemia, unspecified: Secondary | ICD-10-CM | POA: Diagnosis not present

## 2023-02-15 DIAGNOSIS — E1142 Type 2 diabetes mellitus with diabetic polyneuropathy: Secondary | ICD-10-CM | POA: Diagnosis not present

## 2023-02-15 DIAGNOSIS — Z794 Long term (current) use of insulin: Secondary | ICD-10-CM | POA: Diagnosis not present

## 2023-02-22 DIAGNOSIS — E1169 Type 2 diabetes mellitus with other specified complication: Secondary | ICD-10-CM | POA: Diagnosis not present

## 2023-02-22 DIAGNOSIS — Z794 Long term (current) use of insulin: Secondary | ICD-10-CM | POA: Diagnosis not present

## 2023-02-22 DIAGNOSIS — E1142 Type 2 diabetes mellitus with diabetic polyneuropathy: Secondary | ICD-10-CM | POA: Diagnosis not present

## 2023-02-22 DIAGNOSIS — E1159 Type 2 diabetes mellitus with other circulatory complications: Secondary | ICD-10-CM | POA: Diagnosis not present

## 2023-02-27 ENCOUNTER — Encounter: Payer: Self-pay | Admitting: Family Medicine

## 2023-02-27 ENCOUNTER — Ambulatory Visit (INDEPENDENT_AMBULATORY_CARE_PROVIDER_SITE_OTHER): Payer: BC Managed Care – PPO | Admitting: Family Medicine

## 2023-02-27 VITALS — BP 106/71 | HR 94 | Ht 77.0 in | Wt 249.0 lb

## 2023-02-27 DIAGNOSIS — Z794 Long term (current) use of insulin: Secondary | ICD-10-CM

## 2023-02-27 DIAGNOSIS — E11311 Type 2 diabetes mellitus with unspecified diabetic retinopathy with macular edema: Secondary | ICD-10-CM | POA: Diagnosis not present

## 2023-02-27 DIAGNOSIS — K5909 Other constipation: Secondary | ICD-10-CM | POA: Diagnosis not present

## 2023-02-27 DIAGNOSIS — Z800 Family history of malignant neoplasm of digestive organs: Secondary | ICD-10-CM | POA: Diagnosis not present

## 2023-02-27 DIAGNOSIS — I10 Essential (primary) hypertension: Secondary | ICD-10-CM

## 2023-02-27 DIAGNOSIS — Z0000 Encounter for general adult medical examination without abnormal findings: Secondary | ICD-10-CM

## 2023-02-27 MED ORDER — LINACLOTIDE 145 MCG PO CAPS: 145.0000 ug | ORAL_CAPSULE | Freq: Every day | ORAL | 3 refills | Status: AC

## 2023-02-27 NOTE — Progress Notes (Signed)
Established patient visit   Patient: Jimmy Mills   DOB: 05-Aug-1965   57 y.o. Male  MRN: [id number] Visit Date: 02/27/2023  Today's healthcare provider: Mila Merry, MD   Chief Complaint  Patient presents with   Annual Exam   Subjective    Discussed the use of AI scribe software for clinical note transcription with the patient, who gave verbal consent to proceed.  History of Present Illness   The patient, with a history of diabetes, hyperlipidemia, and benign prostatic hyperplasia (BPH), presents for a routine physical. He reports a recent visit to his endocrinologist with a high A1c of 8.0. As a result, a continuous glucose monitor was placed for further evaluation and potential medication adjustment. The patient is currently on a regimen of rosuvastatin, Ozempic, glimepiride, and Synjardy.  The patient also reports a family history of pancreatic cancer, with his mother passing away from the disease at the age of 19 and his sister recently diagnosed with a pancreatic tumor at 10. He expresses concern about his own risk and inquiries about potential screening options.  In addition, the patient mentions a history of retinal issues, for which he receives occasional injections due to fluid accumulation. His vision is reported as stable and continues to follow up with retinal specialist.  Lastly, the patient reports bowel irregularities and expresses interest in restarting Linzess, which he found beneficial in the past. He also mentions a recent adjustment in his hypertension management due to episodes of low blood pressure, with amlodipine being discontinued while losartan is continued.       Medications: Outpatient Medications Prior to Visit  Medication Sig   brimonidine (ALPHAGAN) 0.2 % ophthalmic solution Place 1 drop into the right eye 2 (two) times daily.   dorzolamide-timolol (COSOPT) 22.3-6.8 MG/ML ophthalmic solution Place 1 drop into the right eye 2 (two) times  daily.   Empagliflozin-metFORMIN HCl ER (SYNJARDY XR) 25-1000 MG TB24 Take 1 tablet by mouth daily with breakfast.   glimepiride (AMARYL) 2 MG tablet Take 2 mg by mouth daily as needed (high blood sugar).    losartan (COZAAR) 100 MG tablet Take 100 mg by mouth daily.   Multiple Vitamins-Minerals (MULTIVITAMIN WITH MINERALS) tablet Take 1 tablet by mouth daily.   OZEMPIC, 1 MG/DOSE, 2 MG/1.5ML SOPN Inject 1 Dose as directed every Saturday.    rosuvastatin (CRESTOR) 5 MG tablet Take 5 mg by mouth every Monday, Wednesday, and Friday.    amLODipine (NORVASC) 5 MG tablet Take 5 mg by mouth daily. (Patient not taking: Reported on 02/27/2023)   [DISCONTINUED] ibuprofen (ADVIL) 800 MG tablet Take 1 tablet (800 mg total) by mouth every 8 (eight) hours as needed for mild pain or moderate pain.   [DISCONTINUED] linaclotide (LINZESS) 145 MCG CAPS capsule Take 1 capsule (145 mcg total) by mouth daily before breakfast.   No facility-administered medications prior to visit.   Review of Systems  Constitutional:  Negative for appetite change, chills and fever.  Respiratory:  Negative for chest tightness, shortness of breath and wheezing.   Cardiovascular:  Negative for chest pain and palpitations.  Gastrointestinal:  Negative for abdominal pain, nausea and vomiting.       Objective    BP 106/71 (BP Location: Left Arm, Patient Position: Sitting, Cuff Size: Large)   Pulse 94   Ht 6\' 5"  (1.956 m)   Wt 249 lb (112.9 kg)   SpO2 98%   BMI 29.53 kg/m   Physical Exam   General Appearance:  Well developed, well nourished male. Alert, cooperative, in no acute distress, appears stated age  Head:    Normocephalic, without obvious abnormality, atraumatic  Eyes:    PERRL, conjunctiva/corneas clear, EOM's intact, fundi    benign, both eyes       Ears:    Normal TM's and external ear canals, both ears  Nose:   Nares normal, septum midline, mucosa normal, no drainage   or sinus tenderness  Throat:   Lips,  mucosa, and tongue normal; teeth and gums normal  Neck:   Supple, symmetrical, trachea midline, no adenopathy;       thyroid:  No enlargement/tenderness/nodules; no carotid   bruit or JVD  Back:     Symmetric, no curvature, ROM normal, no CVA tenderness  Lungs:     Clear to auscultation bilaterally, respirations unlabored  Chest wall:    No tenderness or deformity  Heart:    Normal heart rate. Normal rhythm. No murmurs, rubs, or gallops.  S1 and S2 normal  Abdomen:     Soft, non-tender, bowel sounds active all four quadrants,    no masses, no organomegaly  Genitalia:    deferred  Rectal:    deferred  Extremities:   All extremities are intact. No cyanosis or edema  Pulses:   2+ and symmetric all extremities  Skin:   Skin color, texture, turgor normal, no rashes or lesions  Lymph nodes:   Cervical, supraclavicular, and axillary nodes normal  Neurologic:   CNII-XII intact. Normal strength, sensation and reflexes      throughout     Assessment & Plan        Annual complete physical Physical exam was unremarkable. Patient to follow-up regarding pancreatic cancer screening and to monitor blood glucose levels with the continuous glucose monitor.    Diabetes Mellitus Followed by Dr. Turner Daniels with recent A1c=8. Currently on Ozempic 2mg , Glimepiride, and Synjardy. Endocrinologist considering medication changes and has initiated a trial of a continuous glucose monitor. -Continue current medications and monitor glucose levels with the continuous glucose monitor.  Hyperlipidemia Slightly elevated cholesterol. Currently on Rosuvastatin two to three times weekly. -Continue Rosuvastatin twice weekly.  Family History of Pancreatic Cancer Sister recently diagnosed with pancreatic tumor. Mother died of pancreatic cancer. -Consider MRI/MRCP or endoscopic ultrasound for pancreatic cancer screening. Patient to check with sister regarding specific screening method used and to verify insurance  coverage.  Hypertension Blood pressure well controlled on Losartan. Amlodipine was discontinued due to low blood pressure. -Continue Losartan. Monitor blood pressure.  Constipation Previously managed with Linzess, which was effective. Patient wishes to restart this medication. -Restart Linzess. Prescription to be sent to mail order pharmacy.    No follow-ups on file.      Mila Merry, MD  [facility] [phone] (phone) [phone] (fax)  [facility]

## 2023-02-27 NOTE — Patient Instructions (Signed)
Please review the attached list of medications and notify my office if there are any errors.   Screening tests for pancreatic cancer include endoscopy ultrasound and MRCP (a type of MRI). MRCP is less invasive and more thorough, but more expensive. Check with your insurance and let me know if you want to want to proceed with a screening test.

## 2023-03-01 ENCOUNTER — Telehealth: Payer: Self-pay | Admitting: Family Medicine

## 2023-03-01 NOTE — Telephone Encounter (Signed)
Received fax from [employer] for Linzess 145 mcg  Key:  [id number]

## 2023-03-02 NOTE — Telephone Encounter (Signed)
PA has been sent to [facility]

## 2023-03-31 ENCOUNTER — Encounter (INDEPENDENT_AMBULATORY_CARE_PROVIDER_SITE_OTHER): Payer: BC Managed Care – PPO | Admitting: Ophthalmology

## 2023-03-31 DIAGNOSIS — E113391 Type 2 diabetes mellitus with moderate nonproliferative diabetic retinopathy without macular edema, right eye: Secondary | ICD-10-CM

## 2023-03-31 DIAGNOSIS — E113592 Type 2 diabetes mellitus with proliferative diabetic retinopathy without macular edema, left eye: Secondary | ICD-10-CM | POA: Diagnosis not present

## 2023-03-31 DIAGNOSIS — I10 Essential (primary) hypertension: Secondary | ICD-10-CM

## 2023-03-31 DIAGNOSIS — H35033 Hypertensive retinopathy, bilateral: Secondary | ICD-10-CM

## 2023-03-31 DIAGNOSIS — Z7985 Long-term (current) use of injectable non-insulin antidiabetic drugs: Secondary | ICD-10-CM

## 2023-03-31 DIAGNOSIS — H43813 Vitreous degeneration, bilateral: Secondary | ICD-10-CM

## 2023-05-26 ENCOUNTER — Encounter (INDEPENDENT_AMBULATORY_CARE_PROVIDER_SITE_OTHER): Payer: BC Managed Care – PPO | Admitting: Ophthalmology

## 2023-05-26 DIAGNOSIS — I10 Essential (primary) hypertension: Secondary | ICD-10-CM

## 2023-05-26 DIAGNOSIS — E113391 Type 2 diabetes mellitus with moderate nonproliferative diabetic retinopathy without macular edema, right eye: Secondary | ICD-10-CM | POA: Diagnosis not present

## 2023-05-26 DIAGNOSIS — Z7985 Long-term (current) use of injectable non-insulin antidiabetic drugs: Secondary | ICD-10-CM | POA: Diagnosis not present

## 2023-05-26 DIAGNOSIS — H35033 Hypertensive retinopathy, bilateral: Secondary | ICD-10-CM

## 2023-05-26 DIAGNOSIS — H43813 Vitreous degeneration, bilateral: Secondary | ICD-10-CM

## 2023-05-26 DIAGNOSIS — E113512 Type 2 diabetes mellitus with proliferative diabetic retinopathy with macular edema, left eye: Secondary | ICD-10-CM

## 2023-05-30 DIAGNOSIS — H4063X1 Glaucoma secondary to drugs, bilateral, mild stage: Secondary | ICD-10-CM | POA: Diagnosis not present

## 2023-06-19 DIAGNOSIS — E1159 Type 2 diabetes mellitus with other circulatory complications: Secondary | ICD-10-CM | POA: Diagnosis not present

## 2023-06-19 DIAGNOSIS — I152 Hypertension secondary to endocrine disorders: Secondary | ICD-10-CM | POA: Diagnosis not present

## 2023-06-19 DIAGNOSIS — E1142 Type 2 diabetes mellitus with diabetic polyneuropathy: Secondary | ICD-10-CM | POA: Diagnosis not present

## 2023-06-19 DIAGNOSIS — Z794 Long term (current) use of insulin: Secondary | ICD-10-CM | POA: Diagnosis not present

## 2023-06-19 LAB — HEMOGLOBIN A1C: Hemoglobin A1C: 7.7

## 2023-06-22 DIAGNOSIS — E1142 Type 2 diabetes mellitus with diabetic polyneuropathy: Secondary | ICD-10-CM | POA: Diagnosis not present

## 2023-06-22 DIAGNOSIS — E1159 Type 2 diabetes mellitus with other circulatory complications: Secondary | ICD-10-CM | POA: Diagnosis not present

## 2023-06-22 DIAGNOSIS — E1169 Type 2 diabetes mellitus with other specified complication: Secondary | ICD-10-CM | POA: Diagnosis not present

## 2023-06-22 DIAGNOSIS — Z794 Long term (current) use of insulin: Secondary | ICD-10-CM | POA: Diagnosis not present

## 2023-07-21 ENCOUNTER — Encounter (INDEPENDENT_AMBULATORY_CARE_PROVIDER_SITE_OTHER): Payer: BC Managed Care – PPO | Admitting: Ophthalmology

## 2023-07-21 DIAGNOSIS — I10 Essential (primary) hypertension: Secondary | ICD-10-CM

## 2023-07-21 DIAGNOSIS — E103391 Type 1 diabetes mellitus with moderate nonproliferative diabetic retinopathy without macular edema, right eye: Secondary | ICD-10-CM

## 2023-07-21 DIAGNOSIS — H43813 Vitreous degeneration, bilateral: Secondary | ICD-10-CM

## 2023-07-21 DIAGNOSIS — Z7985 Long-term (current) use of injectable non-insulin antidiabetic drugs: Secondary | ICD-10-CM | POA: Diagnosis not present

## 2023-07-21 DIAGNOSIS — E103592 Type 1 diabetes mellitus with proliferative diabetic retinopathy without macular edema, left eye: Secondary | ICD-10-CM

## 2023-07-21 DIAGNOSIS — H35033 Hypertensive retinopathy, bilateral: Secondary | ICD-10-CM

## 2023-07-24 LAB — HM DIABETES EYE EXAM

## 2023-08-07 NOTE — Progress Notes (Unsigned)
[id number] 

## 2023-09-22 ENCOUNTER — Encounter (INDEPENDENT_AMBULATORY_CARE_PROVIDER_SITE_OTHER): Admitting: Ophthalmology

## 2023-09-22 DIAGNOSIS — E103592 Type 1 diabetes mellitus with proliferative diabetic retinopathy without macular edema, left eye: Secondary | ICD-10-CM | POA: Diagnosis not present

## 2023-09-22 DIAGNOSIS — Z7985 Long-term (current) use of injectable non-insulin antidiabetic drugs: Secondary | ICD-10-CM

## 2023-09-22 DIAGNOSIS — H43813 Vitreous degeneration, bilateral: Secondary | ICD-10-CM

## 2023-09-22 DIAGNOSIS — E103391 Type 1 diabetes mellitus with moderate nonproliferative diabetic retinopathy without macular edema, right eye: Secondary | ICD-10-CM | POA: Diagnosis not present

## 2023-09-22 DIAGNOSIS — I10 Essential (primary) hypertension: Secondary | ICD-10-CM

## 2023-09-22 DIAGNOSIS — H35033 Hypertensive retinopathy, bilateral: Secondary | ICD-10-CM

## 2023-10-27 DIAGNOSIS — Z794 Long term (current) use of insulin: Secondary | ICD-10-CM | POA: Diagnosis not present

## 2023-10-27 DIAGNOSIS — E1142 Type 2 diabetes mellitus with diabetic polyneuropathy: Secondary | ICD-10-CM | POA: Diagnosis not present

## 2023-10-27 DIAGNOSIS — E1159 Type 2 diabetes mellitus with other circulatory complications: Secondary | ICD-10-CM | POA: Diagnosis not present

## 2023-10-27 DIAGNOSIS — E785 Hyperlipidemia, unspecified: Secondary | ICD-10-CM | POA: Diagnosis not present

## 2023-10-27 DIAGNOSIS — E1169 Type 2 diabetes mellitus with other specified complication: Secondary | ICD-10-CM | POA: Diagnosis not present

## 2023-10-27 DIAGNOSIS — I152 Hypertension secondary to endocrine disorders: Secondary | ICD-10-CM | POA: Diagnosis not present

## 2023-10-27 LAB — COMPREHENSIVE METABOLIC PANEL WITH GFR: eGFR: 76

## 2023-10-27 LAB — HEMOGLOBIN A1C: Hemoglobin A1C: 6.8

## 2023-10-31 DIAGNOSIS — E1159 Type 2 diabetes mellitus with other circulatory complications: Secondary | ICD-10-CM | POA: Diagnosis not present

## 2023-10-31 DIAGNOSIS — Z794 Long term (current) use of insulin: Secondary | ICD-10-CM | POA: Diagnosis not present

## 2023-10-31 DIAGNOSIS — E1142 Type 2 diabetes mellitus with diabetic polyneuropathy: Secondary | ICD-10-CM | POA: Diagnosis not present

## 2023-10-31 DIAGNOSIS — E1169 Type 2 diabetes mellitus with other specified complication: Secondary | ICD-10-CM | POA: Diagnosis not present

## 2023-12-01 ENCOUNTER — Encounter (INDEPENDENT_AMBULATORY_CARE_PROVIDER_SITE_OTHER): Admitting: Ophthalmology

## 2023-12-04 ENCOUNTER — Encounter (INDEPENDENT_AMBULATORY_CARE_PROVIDER_SITE_OTHER): Admitting: Ophthalmology

## 2023-12-06 DIAGNOSIS — H4063X1 Glaucoma secondary to drugs, bilateral, mild stage: Secondary | ICD-10-CM | POA: Diagnosis not present

## 2023-12-06 DIAGNOSIS — E119 Type 2 diabetes mellitus without complications: Secondary | ICD-10-CM | POA: Diagnosis not present

## 2023-12-06 LAB — OPHTHALMOLOGY REPORT-SCANNED

## 2023-12-08 ENCOUNTER — Encounter (INDEPENDENT_AMBULATORY_CARE_PROVIDER_SITE_OTHER): Admitting: Ophthalmology

## 2023-12-08 DIAGNOSIS — E113311 Type 2 diabetes mellitus with moderate nonproliferative diabetic retinopathy with macular edema, right eye: Secondary | ICD-10-CM

## 2023-12-08 DIAGNOSIS — H43813 Vitreous degeneration, bilateral: Secondary | ICD-10-CM

## 2023-12-08 DIAGNOSIS — E113592 Type 2 diabetes mellitus with proliferative diabetic retinopathy without macular edema, left eye: Secondary | ICD-10-CM | POA: Diagnosis not present

## 2023-12-08 DIAGNOSIS — H35033 Hypertensive retinopathy, bilateral: Secondary | ICD-10-CM

## 2023-12-08 DIAGNOSIS — I10 Essential (primary) hypertension: Secondary | ICD-10-CM

## 2023-12-08 DIAGNOSIS — Z7985 Long-term (current) use of injectable non-insulin antidiabetic drugs: Secondary | ICD-10-CM

## 2024-02-02 ENCOUNTER — Encounter (INDEPENDENT_AMBULATORY_CARE_PROVIDER_SITE_OTHER): Admitting: Ophthalmology

## 2024-02-02 DIAGNOSIS — E103391 Type 1 diabetes mellitus with moderate nonproliferative diabetic retinopathy without macular edema, right eye: Secondary | ICD-10-CM | POA: Diagnosis not present

## 2024-02-02 DIAGNOSIS — Z7985 Long-term (current) use of injectable non-insulin antidiabetic drugs: Secondary | ICD-10-CM

## 2024-02-02 DIAGNOSIS — H43813 Vitreous degeneration, bilateral: Secondary | ICD-10-CM

## 2024-02-02 DIAGNOSIS — I10 Essential (primary) hypertension: Secondary | ICD-10-CM | POA: Diagnosis not present

## 2024-02-02 DIAGNOSIS — E103592 Type 1 diabetes mellitus with proliferative diabetic retinopathy without macular edema, left eye: Secondary | ICD-10-CM | POA: Diagnosis not present

## 2024-02-02 DIAGNOSIS — H35033 Hypertensive retinopathy, bilateral: Secondary | ICD-10-CM

## 2024-02-28 ENCOUNTER — Ambulatory Visit: Admitting: Family Medicine

## 2024-02-28 ENCOUNTER — Encounter: Payer: Self-pay | Admitting: Family Medicine

## 2024-02-28 VITALS — BP 104/82 | HR 72 | Temp 98.2°F | Ht 77.0 in | Wt 238.0 lb

## 2024-02-28 DIAGNOSIS — Z23 Encounter for immunization: Secondary | ICD-10-CM

## 2024-02-28 DIAGNOSIS — E11311 Type 2 diabetes mellitus with unspecified diabetic retinopathy with macular edema: Secondary | ICD-10-CM

## 2024-02-28 DIAGNOSIS — Z125 Encounter for screening for malignant neoplasm of prostate: Secondary | ICD-10-CM

## 2024-02-28 DIAGNOSIS — Z794 Long term (current) use of insulin: Secondary | ICD-10-CM | POA: Diagnosis not present

## 2024-02-28 NOTE — Progress Notes (Signed)
 Complete physical exam   Patient: Jimmy Mills   DOB: [birth year]   58 y.o. Male  MRN: [id number] Visit Date: 02/28/2024  Today's healthcare provider: Nancyann Perry, MD   Chief Complaint  Patient presents with   Annual Exam    Diet- General Exercise- 4 miles a day Overall feeling- Pretty good Sleep- 6-7 hours a day Concerns- Taking Mounjaro, endo put him on it having GI issues since being put on it.  Vaccines discuss with provider.  Diabetic Eye Exam- Will request from [facility], Dr. Fleeta   Subjective    Discussed the use of AI scribe software for clinical note transcription with the patient, who gave verbal consent to proceed.  History of Present Illness   Jimmy Mills is a 58 year old male with diabetes and hyperlipidemia who presents for an annual physical exam.  He recently switched from Ozempic to Mounjaro due to rising A1c levels, which have since improved. He reports that he is currently on the highest dose of Mounjaro. His last A1c was 6.8.  He is experiencing digestive issues, including a sensation of delayed stomach emptying, constipation, and diarrhea, which occur in a cyclical pattern. He has been taking fiber supplements for about a month to help manage these symptoms.  He is taking rosuvastatin  three times a week for cholesterol management, having switched from Crestor . He reports that he has not experienced any side effects from this medication.  He regularly sees his eye doctor twice a year and a diabetic retina specialist every two months. He had a colonoscopy in 2021 and reports regular eye exams. He has received flu and COVID vaccines but has not had the pneumonia or hepatitis B vaccines yet.  No history of smoking. No chest pain, heart flutters, or swelling in his hands, feet, or ankles.      Lab Results  Component Value Date   HGBA1C 6.8 10/27/2023   Lab Results  Component Value Date   CHOL 149 02/03/2022   HDL 52 02/03/2022    LDLCALC 86 02/03/2022   TRIG 50 02/03/2022       Past Medical History:  Diagnosis Date   Diabetes mellitus without complication (HCC)    History of chicken pox 12/30/2014   DID have Chicken Pox. DID have Mumps.     Hypertension    Past Surgical History:  Procedure Laterality Date   BACK SURGERY     CATARACT EXTRACTION Left 06/2014   CERVICAL FUSION  11/10/2014   COLONOSCOPY WITH PROPOFOL  N/A 07/19/2019   Procedure: COLONOSCOPY WITH PROPOFOL ;  Surgeon: Jinny Carmine, MD;  Location: [facility] ENDOSCOPY;  Service: Endoscopy;  Laterality: N/A;   SHOULDER ARTHROSCOPY Right    XI ROBOTIC LAPAROSCOPIC ASSISTED APPENDECTOMY N/A 02/19/2020   Procedure: XI ROBOTIC LAPAROSCOPIC ASSISTED APPENDECTOMY;  Surgeon: Tye Millet, DO;  Location: [facility] ORS;  Service: General;  Laterality: N/A;   Social History   Socioeconomic History   Marital status: Divorced    Spouse name: Not on file   Number of children: 2   Years of education: Not on file   Highest education level: Not on file  Occupational History    Employer: [employer]    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  Tobacco Use   Smoking status: Never   Smokeless tobacco: Never  Substance and Sexual Activity   Alcohol use: No   Drug use: No   Sexual activity: Yes  Other Topics Concern   Not on file  Social History Narrative   Not on file   Social Drivers of Health   Financial Resource Strain: Not on file  Food Insecurity: No Food Insecurity (05/06/2022)   Hunger Vital Sign    Worried About Running Out of Food in the Last Year: Never true    Ran Out of Food in the Last Year: Never true  Transportation Needs: No Transportation Needs (05/06/2022)   PRAPARE - Administrator, Civil Service (Medical): No    Lack of Transportation (Non-Medical): No  Physical Activity: Not on file  Stress: Not on file  Social Connections: Not on file  Intimate Partner Violence: Not on file   Family Status  Relation Name Status   Mother   Deceased   Father  Deceased   Sister  Alive   Sister  Alive   Daughter  Alive   Son  Alive  No partnership data on file   Family History  Problem Relation Age of Onset   Diabetes Mother    Glaucoma Mother    Liver cancer Mother    Pancreatic cancer Mother    Cancer Father        cancer of the prostate   Dementia Father    Pancreatic cancer Sister    Allergies  Allergen Reactions   Doxycycline Rash    Patient Care Team: Gasper Nancyann BRAVO, MD as PCP - General (Family Medicine) Alvia Norleen BIRCH, MD as Consulting Physician (Ophthalmology) Cherilyn Debby CROME, MD as Consulting Physician (Endocrinology) Cleatus Collar, MD as Consulting Physician (Ophthalmology)   Medications: Outpatient Medications Prior to Visit  Medication Sig   brimonidine  (ALPHAGAN ) 0.2 % ophthalmic solution Place 1 drop into the right eye 2 (two) times daily.   dorzolamide -timolol  (COSOPT ) 22.3-6.8 MG/ML ophthalmic solution Place 1 drop into the right eye 2 (two) times daily.   Empagliflozin-metFORMIN HCl ER (SYNJARDY XR) 25-1000 MG TB24 Take 1 tablet by mouth daily with breakfast.   glimepiride  (AMARYL ) 2 MG tablet Take 2 mg by mouth daily as needed (high blood sugar).    linaclotide  (LINZESS ) 145 MCG CAPS capsule Take 1 capsule (145 mcg total) by mouth daily before breakfast.   losartan  (COZAAR ) 100 MG tablet Take 100 mg by mouth daily.   Multiple Vitamins-Minerals (MULTIVITAMIN WITH MINERALS) tablet Take 1 tablet by mouth daily.   rosuvastatin  (CRESTOR ) 5 MG tablet Take 5 mg by mouth every Monday, Wednesday, and Friday.    tirzepatide (MOUNJARO) 15 MG/0.5ML Pen Inject 15 mg into the skin once a week.   [DISCONTINUED] OZEMPIC, 1 MG/DOSE, 2 MG/1.5ML SOPN Inject 1 Dose as directed every Saturday.    No facility-administered medications prior to visit.      Objective    BP 104/82 (BP Location: Left Arm, Patient Position: Sitting, Cuff Size: Normal)   Pulse 72   Temp 98.2 F (36.8 C) (Oral)   Ht 6'  5 (1.956 m)   Wt 238 lb (108 kg)   SpO2 100%   BMI 28.22 kg/m    Physical Exam  General Appearance:    Well developed, well nourished male. Alert, cooperative, in no acute distress, appears stated age  Head:    Normocephalic, without obvious abnormality, atraumatic  Eyes:    PERRL, conjunctiva/corneas clear, EOM's intact, fundi    benign, both eyes       Ears:    Normal TM's and external ear canals, both ears  Nose:   Nares normal, septum midline, mucosa normal, no drainage   or sinus  tenderness  Throat:   Lips, mucosa, and tongue normal; teeth and gums normal  Neck:   Supple, symmetrical, trachea midline, no adenopathy;       thyroid :  No enlargement/tenderness/nodules; no carotid   bruit or JVD  Back:     Symmetric, no curvature, ROM normal, no CVA tenderness  Lungs:     Clear to auscultation bilaterally, respirations unlabored  Chest wall:    No tenderness or deformity  Heart:    Normal heart rate. Normal rhythm. No murmurs, rubs, or gallops.  S1 and S2 normal  Abdomen:     Soft, non-tender, bowel sounds active all four quadrants,    no masses, no organomegaly  Genitalia:    deferred  Rectal:    deferred  Extremities:   All extremities are intact. No cyanosis or edema  Pulses:   2+ and symmetric all extremities  Skin:   Skin color, texture, turgor normal, no rashes or lesions  Lymph nodes:   Cervical, supraclavicular, and axillary nodes normal  Neurologic:   CNII-XII intact. Normal strength, sensation and reflexes      throughout       Last depression screening scores    02/28/2024    3:49 PM 02/27/2023   10:12 AM 02/21/2022   10:57 AM  PHQ 2/9 Scores  PHQ - 2 Score 0 0 0  PHQ- 9 Score 0 0  0      Data saved with a previous flowsheet row definition   Last fall risk screening    02/28/2024    3:49 PM  Fall Risk   Falls in the past year? 0  Number falls in past yr: 0  Injury with Fall? 0  Risk for fall due to : No Fall Risks   Last Audit-C alcohol use  screening    02/21/2022   10:57 AM  Alcohol Use Disorder Test (AUDIT)  1. How often do you have a drink containing alcohol? 0  2. How many drinks containing alcohol do you have on a typical day when you are drinking? 0  3. How often do you have six or more drinks on one occasion? 0  AUDIT-C Score 0   A score of 3 or more in women, and 4 or more in men indicates increased risk for alcohol abuse, EXCEPT if all of the points are from question 1   Results for orders placed or performed in visit on 02/28/24  Comprehensive metabolic panel with GFR  Result Value Ref Range   eGFR 76   Hemoglobin A1c  Result Value Ref Range   Hemoglobin A1C 6.8   HM DIABETES EYE EXAM  Result Value Ref Range   HM Diabetic Eye Exam Retinopathy (A) No Retinopathy    Assessment & Plan    Routine Health Maintenance and Physical Exam  Exercise Activities and Dietary recommendations  Goals   None     Immunization History  Administered Date(s) Administered   Hepb-cpg 02/28/2024   Influenza Inj Mdck Quad Pf 01/12/2022, 02/09/2022   Influenza, Seasonal, Injecte, Preservative Fre 01/02/2024   Influenza,inj,Quad PF,6+ Mos 11/24/2016, 11/28/2017, 12/05/2018   Influenza-Unspecified 12/27/2014, 11/30/2022   PFIZER Comirnaty(Gray Top)Covid-19 Tri-Sucrose Vaccine 07/15/2020   PFIZER(Purple Top)SARS-COV-2 Vaccination 05/27/2019, 06/17/2019, 12/21/2019   PNEUMOCOCCAL CONJUGATE-20 02/28/2024   Pfizer(Comirnaty)Fall Seasonal Vaccine 12 years and older 01/05/2022, 11/30/2022, 01/02/2024   Pneumococcal Polysaccharide-23 02/24/2011   Tdap 02/24/2011, 02/21/2022   Zoster Recombinant(Shingrix ) 11/28/2017, 01/29/2018    Health Maintenance  Topic Date Due   Diabetic kidney  evaluation - Urine ACR  07/31/2022   Hepatitis B Vaccines 19-59 Average Risk (2 of 2 - CpG 2-dose series) 03/27/2024   HEMOGLOBIN A1C  04/28/2024   OPHTHALMOLOGY EXAM  09/21/2024   Diabetic kidney evaluation - eGFR measurement  10/26/2024    Colonoscopy  07/18/2029   DTaP/Tdap/Td (3 - Td or Tdap) 02/22/2032   Pneumococcal Vaccine: 50+ Years  Completed   Influenza Vaccine  Completed   COVID-19 Vaccine  Completed   Hepatitis C Screening  Completed   HIV Screening  Completed   Zoster Vaccines- Shingrix   Completed   HPV VACCINES  Aged Out   Meningococcal B Vaccine  Aged Out    Discussed health benefits of physical activity, and encouraged him to engage in regular exercise appropriate for his age and condition.     General medical examination Routine physical examination with no acute concerns. Recent colonoscopy in 2021 and regular eye examinations with diabetic retina specialist. - Ordered PSA test. - Ordered urine kidney test. - Administered pneumonia vaccine. - Administered first dose of hepatitis B vaccine.  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 Regular follow-ups with diabetic retina specialist every two months. - Continue current diabetes management with Mounjaro. - Continue regular follow-ups with diabetic retina specialist.  Dyslipidemia Managed with rosuvastatin  (Crestor ) three times a week. Cholesterol levels are well-controlled with no reported side effects. - Continue rosuvastatin  (Crestor ) three times a week.  Gastrointestinal symptoms related to GLP-1 agonist therapy Gastrointestinal symptoms including constipation and diarrhea since switching from Ozempic to Mounjaro. Symptoms may be related to the highest dose of Mounjaro. Fiber supplements started a month ago to help with bowel movements. - Continue fiber supplements. - Discuss potential dose adjustment of Mounjaro with endocrinologist.             Nancyann Perry, MD  [facility] [phone] (phone) [phone] (fax)  [facility]

## 2024-02-29 ENCOUNTER — Ambulatory Visit: Payer: Self-pay | Admitting: Family Medicine

## 2024-02-29 LAB — MICROALBUMIN / CREATININE URINE RATIO
Creatinine, Urine: 89.6 mg/dL
Microalb/Creat Ratio: 4 mg/g{creat} (ref 0–29)
Microalbumin, Urine: 3.4 ug/mL

## 2024-02-29 LAB — PSA TOTAL (REFLEX TO FREE): Prostate Specific Ag, Serum: 0.5 ng/mL (ref 0.0–4.0)

## 2024-03-15 ENCOUNTER — Encounter (INDEPENDENT_AMBULATORY_CARE_PROVIDER_SITE_OTHER): Admitting: Ophthalmology

## 2024-03-15 DIAGNOSIS — E113592 Type 2 diabetes mellitus with proliferative diabetic retinopathy without macular edema, left eye: Secondary | ICD-10-CM

## 2024-03-15 DIAGNOSIS — H43813 Vitreous degeneration, bilateral: Secondary | ICD-10-CM | POA: Diagnosis not present

## 2024-03-15 DIAGNOSIS — E113391 Type 2 diabetes mellitus with moderate nonproliferative diabetic retinopathy without macular edema, right eye: Secondary | ICD-10-CM

## 2024-03-15 DIAGNOSIS — H35033 Hypertensive retinopathy, bilateral: Secondary | ICD-10-CM | POA: Diagnosis not present

## 2024-03-15 DIAGNOSIS — Z7985 Long-term (current) use of injectable non-insulin antidiabetic drugs: Secondary | ICD-10-CM | POA: Diagnosis not present

## 2024-03-15 DIAGNOSIS — I10 Essential (primary) hypertension: Secondary | ICD-10-CM

## 2024-04-08 ENCOUNTER — Ambulatory Visit (INDEPENDENT_AMBULATORY_CARE_PROVIDER_SITE_OTHER): Admitting: Family Medicine

## 2024-04-08 DIAGNOSIS — Z23 Encounter for immunization: Secondary | ICD-10-CM | POA: Diagnosis not present

## 2024-04-15 NOTE — Progress Notes (Signed)
 Immunization only, no E&M

## 2024-05-10 ENCOUNTER — Encounter (INDEPENDENT_AMBULATORY_CARE_PROVIDER_SITE_OTHER): Payer: Self-pay | Admitting: Ophthalmology
